# Patient Record
Sex: Female | Born: 1970 | Race: White | Hispanic: No | Smoking: Current every day smoker
Health system: Southern US, Community
[De-identification: ages and names within clinical notes are randomized; demographics above are authoritative.]

## PROBLEM LIST (undated history)

## (undated) DIAGNOSIS — R12 Heartburn: Secondary | ICD-10-CM

## (undated) DIAGNOSIS — N189 Chronic kidney disease, unspecified: Secondary | ICD-10-CM

## (undated) DIAGNOSIS — I10 Essential (primary) hypertension: Secondary | ICD-10-CM

## (undated) DIAGNOSIS — K219 Gastro-esophageal reflux disease without esophagitis: Secondary | ICD-10-CM

## (undated) DIAGNOSIS — E079 Disorder of thyroid, unspecified: Secondary | ICD-10-CM

## (undated) HISTORY — DX: Essential (primary) hypertension: I10

## (undated) HISTORY — DX: Heartburn: R12

## (undated) HISTORY — DX: Disorder of thyroid, unspecified: E07.9

---

## 1998-06-21 ENCOUNTER — Other Ambulatory Visit: Admission: RE | Admit: 1998-06-21 | Discharge: 1998-06-21 | Payer: Self-pay | Admitting: Obstetrics and Gynecology

## 1999-01-07 ENCOUNTER — Inpatient Hospital Stay (HOSPITAL_COMMUNITY): Admission: AD | Admit: 1999-01-07 | Discharge: 1999-02-04 | Payer: Self-pay | Admitting: Obstetrics and Gynecology

## 1999-01-08 ENCOUNTER — Encounter: Payer: Self-pay | Admitting: *Deleted

## 1999-01-09 ENCOUNTER — Encounter: Payer: Self-pay | Admitting: Nephrology

## 1999-01-09 ENCOUNTER — Encounter: Payer: Self-pay | Admitting: General Surgery

## 1999-01-11 ENCOUNTER — Encounter: Payer: Self-pay | Admitting: General Surgery

## 1999-01-12 ENCOUNTER — Encounter: Payer: Self-pay | Admitting: Obstetrics & Gynecology

## 1999-01-12 ENCOUNTER — Encounter: Payer: Self-pay | Admitting: Pulmonary Disease

## 1999-01-13 ENCOUNTER — Encounter: Payer: Self-pay | Admitting: Cardiothoracic Surgery

## 1999-01-14 ENCOUNTER — Encounter: Payer: Self-pay | Admitting: Pulmonary Disease

## 1999-01-14 ENCOUNTER — Encounter: Payer: Self-pay | Admitting: Cardiothoracic Surgery

## 1999-01-15 ENCOUNTER — Encounter: Payer: Self-pay | Admitting: Cardiothoracic Surgery

## 1999-01-16 ENCOUNTER — Encounter: Payer: Self-pay | Admitting: Cardiothoracic Surgery

## 1999-01-19 ENCOUNTER — Encounter: Payer: Self-pay | Admitting: Cardiothoracic Surgery

## 1999-01-20 ENCOUNTER — Encounter: Payer: Self-pay | Admitting: Neurosurgery

## 1999-01-20 ENCOUNTER — Encounter: Payer: Self-pay | Admitting: Pulmonary Disease

## 1999-01-23 ENCOUNTER — Encounter: Payer: Self-pay | Admitting: Neurosurgery

## 1999-01-28 ENCOUNTER — Encounter: Payer: Self-pay | Admitting: Pulmonary Disease

## 1999-01-28 ENCOUNTER — Encounter: Payer: Self-pay | Admitting: Neurosurgery

## 1999-01-30 ENCOUNTER — Encounter: Payer: Self-pay | Admitting: Pulmonary Disease

## 1999-02-04 ENCOUNTER — Encounter: Payer: Self-pay | Admitting: Pulmonary Disease

## 1999-02-14 ENCOUNTER — Other Ambulatory Visit: Admission: RE | Admit: 1999-02-14 | Discharge: 1999-02-14 | Payer: Self-pay | Admitting: Obstetrics and Gynecology

## 1999-05-05 ENCOUNTER — Encounter: Payer: Self-pay | Admitting: Neurosurgery

## 1999-05-05 ENCOUNTER — Ambulatory Visit (HOSPITAL_COMMUNITY): Admission: RE | Admit: 1999-05-05 | Discharge: 1999-05-05 | Payer: Self-pay | Admitting: Neurosurgery

## 1999-07-23 ENCOUNTER — Encounter: Payer: Self-pay | Admitting: Neurosurgery

## 1999-07-23 ENCOUNTER — Ambulatory Visit (HOSPITAL_COMMUNITY): Admission: RE | Admit: 1999-07-23 | Discharge: 1999-07-23 | Payer: Self-pay | Admitting: Neurosurgery

## 2000-02-04 ENCOUNTER — Encounter: Payer: Self-pay | Admitting: Neurosurgery

## 2000-02-04 ENCOUNTER — Ambulatory Visit (HOSPITAL_COMMUNITY): Admission: RE | Admit: 2000-02-04 | Discharge: 2000-02-04 | Payer: Self-pay | Admitting: Neurosurgery

## 2000-02-25 ENCOUNTER — Other Ambulatory Visit: Admission: RE | Admit: 2000-02-25 | Discharge: 2000-02-25 | Payer: Self-pay | Admitting: Obstetrics and Gynecology

## 2001-04-01 ENCOUNTER — Other Ambulatory Visit: Admission: RE | Admit: 2001-04-01 | Discharge: 2001-04-01 | Payer: Self-pay | Admitting: Obstetrics and Gynecology

## 2002-03-22 ENCOUNTER — Other Ambulatory Visit: Admission: RE | Admit: 2002-03-22 | Discharge: 2002-03-22 | Payer: Self-pay | Admitting: Obstetrics and Gynecology

## 2003-03-24 ENCOUNTER — Other Ambulatory Visit: Admission: RE | Admit: 2003-03-24 | Discharge: 2003-03-24 | Payer: Self-pay | Admitting: Obstetrics and Gynecology

## 2004-05-31 ENCOUNTER — Other Ambulatory Visit: Admission: RE | Admit: 2004-05-31 | Discharge: 2004-05-31 | Payer: Self-pay | Admitting: Obstetrics and Gynecology

## 2005-05-11 ENCOUNTER — Emergency Department (HOSPITAL_COMMUNITY): Admission: EM | Admit: 2005-05-11 | Discharge: 2005-05-11 | Payer: Self-pay | Admitting: *Deleted

## 2009-06-26 ENCOUNTER — Ambulatory Visit (HOSPITAL_COMMUNITY): Admission: RE | Admit: 2009-06-26 | Discharge: 2009-06-26 | Payer: Self-pay | Admitting: Gastroenterology

## 2009-07-24 ENCOUNTER — Encounter: Admission: RE | Admit: 2009-07-24 | Discharge: 2009-07-24 | Payer: Self-pay | Admitting: General Surgery

## 2011-11-13 ENCOUNTER — Other Ambulatory Visit: Payer: Self-pay | Admitting: Family Medicine

## 2011-11-13 DIAGNOSIS — Z1231 Encounter for screening mammogram for malignant neoplasm of breast: Secondary | ICD-10-CM

## 2011-11-25 ENCOUNTER — Ambulatory Visit
Admission: RE | Admit: 2011-11-25 | Discharge: 2011-11-25 | Disposition: A | Discharge status: Home / Self Care | Payer: BC Managed Care – PPO | Source: Ambulatory Visit | Attending: Family Medicine | Admitting: Family Medicine

## 2011-11-25 DIAGNOSIS — Z1231 Encounter for screening mammogram for malignant neoplasm of breast: Secondary | ICD-10-CM

## 2011-11-27 ENCOUNTER — Other Ambulatory Visit: Payer: Self-pay | Admitting: Family Medicine

## 2011-11-27 DIAGNOSIS — R928 Other abnormal and inconclusive findings on diagnostic imaging of breast: Secondary | ICD-10-CM

## 2011-12-01 ENCOUNTER — Ambulatory Visit
Admission: RE | Admit: 2011-12-01 | Discharge: 2011-12-01 | Disposition: A | Discharge status: Home / Self Care | Payer: BC Managed Care – PPO | Source: Ambulatory Visit | Attending: Family Medicine | Admitting: Family Medicine

## 2011-12-01 ENCOUNTER — Other Ambulatory Visit: Payer: BC Managed Care – PPO

## 2011-12-01 DIAGNOSIS — R928 Other abnormal and inconclusive findings on diagnostic imaging of breast: Secondary | ICD-10-CM

## 2012-01-15 ENCOUNTER — Other Ambulatory Visit: Payer: Self-pay | Admitting: Family Medicine

## 2012-01-15 DIAGNOSIS — H532 Diplopia: Secondary | ICD-10-CM

## 2012-01-15 DIAGNOSIS — R51 Headache: Secondary | ICD-10-CM

## 2012-01-18 ENCOUNTER — Ambulatory Visit
Admission: RE | Admit: 2012-01-18 | Discharge: 2012-01-18 | Disposition: A | Discharge status: Home / Self Care | Payer: BC Managed Care – PPO | Source: Ambulatory Visit | Attending: Family Medicine | Admitting: Family Medicine

## 2012-01-18 DIAGNOSIS — R51 Headache: Secondary | ICD-10-CM

## 2012-01-18 DIAGNOSIS — H532 Diplopia: Secondary | ICD-10-CM

## 2012-01-18 MED ORDER — GADOBENATE DIMEGLUMINE 529 MG/ML IV SOLN
17.0000 mL | Freq: Once | INTRAVENOUS | Status: AC | PRN
  Administered 2012-01-18: 17 mL via INTRAVENOUS

## 2012-06-01 ENCOUNTER — Other Ambulatory Visit: Payer: Self-pay | Admitting: Obstetrics

## 2012-06-01 DIAGNOSIS — N6489 Other specified disorders of breast: Secondary | ICD-10-CM

## 2012-07-07 ENCOUNTER — Ambulatory Visit
Admission: RE | Admit: 2012-07-07 | Discharge: 2012-07-07 | Disposition: A | Discharge status: Home / Self Care | Payer: BC Managed Care – PPO | Source: Ambulatory Visit | Attending: Obstetrics | Admitting: Obstetrics

## 2012-07-07 DIAGNOSIS — N6489 Other specified disorders of breast: Secondary | ICD-10-CM

## 2012-12-22 ENCOUNTER — Other Ambulatory Visit: Payer: Self-pay | Admitting: Obstetrics

## 2012-12-22 DIAGNOSIS — R922 Inconclusive mammogram: Secondary | ICD-10-CM

## 2012-12-22 DIAGNOSIS — N6489 Other specified disorders of breast: Secondary | ICD-10-CM

## 2013-01-10 ENCOUNTER — Ambulatory Visit
Admission: RE | Admit: 2013-01-10 | Discharge: 2013-01-10 | Disposition: A | Discharge status: Home / Self Care | Payer: BC Managed Care – PPO | Source: Ambulatory Visit | Attending: Obstetrics | Admitting: Obstetrics

## 2013-01-10 DIAGNOSIS — N6489 Other specified disorders of breast: Secondary | ICD-10-CM

## 2013-01-10 DIAGNOSIS — R922 Inconclusive mammogram: Secondary | ICD-10-CM

## 2014-03-17 ENCOUNTER — Other Ambulatory Visit: Payer: Self-pay

## 2014-03-17 DIAGNOSIS — Z1231 Encounter for screening mammogram for malignant neoplasm of breast: Secondary | ICD-10-CM

## 2014-04-12 ENCOUNTER — Ambulatory Visit
Admission: RE | Admit: 2014-04-12 | Discharge: 2014-04-12 | Disposition: A | Discharge status: Home / Self Care | Payer: BC Managed Care – PPO | Source: Ambulatory Visit

## 2014-04-12 ENCOUNTER — Encounter (INDEPENDENT_AMBULATORY_CARE_PROVIDER_SITE_OTHER): Payer: Self-pay

## 2014-04-12 DIAGNOSIS — Z1231 Encounter for screening mammogram for malignant neoplasm of breast: Secondary | ICD-10-CM

## 2015-04-02 ENCOUNTER — Other Ambulatory Visit: Payer: Self-pay

## 2015-04-02 DIAGNOSIS — Z1231 Encounter for screening mammogram for malignant neoplasm of breast: Secondary | ICD-10-CM

## 2015-04-20 ENCOUNTER — Ambulatory Visit
Admission: RE | Admit: 2015-04-20 | Discharge: 2015-04-20 | Disposition: A | Discharge status: Home / Self Care | Payer: 59 | Source: Ambulatory Visit

## 2015-04-20 DIAGNOSIS — Z1231 Encounter for screening mammogram for malignant neoplasm of breast: Secondary | ICD-10-CM

## 2017-04-02 ENCOUNTER — Other Ambulatory Visit: Payer: Self-pay | Admitting: Gastroenterology

## 2017-04-02 DIAGNOSIS — R1011 Right upper quadrant pain: Secondary | ICD-10-CM

## 2017-04-06 ENCOUNTER — Encounter (HOSPITAL_COMMUNITY)
Admission: RE | Admit: 2017-04-06 | Discharge: 2017-04-06 | Disposition: A | Discharge status: Home / Self Care | Payer: 59 | Source: Ambulatory Visit | Attending: Gastroenterology | Admitting: Gastroenterology

## 2017-04-06 DIAGNOSIS — R1011 Right upper quadrant pain: Secondary | ICD-10-CM | POA: Diagnosis present

## 2017-04-06 MED ORDER — TECHNETIUM TC 99M MEBROFENIN IV KIT
5.3300 | PACK | Freq: Once | INTRAVENOUS | Status: AC | PRN
  Administered 2017-04-06: 5.33 via INTRAVENOUS

## 2017-07-06 DIAGNOSIS — N898 Other specified noninflammatory disorders of vagina: Secondary | ICD-10-CM | POA: Diagnosis not present

## 2017-11-25 DIAGNOSIS — I10 Essential (primary) hypertension: Secondary | ICD-10-CM | POA: Diagnosis not present

## 2017-11-25 DIAGNOSIS — N390 Urinary tract infection, site not specified: Secondary | ICD-10-CM | POA: Diagnosis not present

## 2017-11-26 ENCOUNTER — Other Ambulatory Visit: Payer: Self-pay | Admitting: Nephrology

## 2017-11-26 DIAGNOSIS — R3129 Other microscopic hematuria: Secondary | ICD-10-CM | POA: Diagnosis not present

## 2017-11-27 ENCOUNTER — Ambulatory Visit
Admission: RE | Admit: 2017-11-27 | Discharge: 2017-11-27 | Disposition: A | Discharge status: Home / Self Care | Payer: 59 | Source: Ambulatory Visit | Attending: Nephrology | Admitting: Nephrology

## 2017-11-27 DIAGNOSIS — R3129 Other microscopic hematuria: Secondary | ICD-10-CM | POA: Diagnosis not present

## 2017-11-30 DIAGNOSIS — R3129 Other microscopic hematuria: Secondary | ICD-10-CM | POA: Diagnosis not present

## 2017-11-30 DIAGNOSIS — I10 Essential (primary) hypertension: Secondary | ICD-10-CM | POA: Diagnosis not present

## 2017-11-30 DIAGNOSIS — N390 Urinary tract infection, site not specified: Secondary | ICD-10-CM | POA: Diagnosis not present

## 2017-12-07 ENCOUNTER — Other Ambulatory Visit: Payer: Self-pay | Admitting: Nephrology

## 2017-12-07 DIAGNOSIS — R8281 Pyuria: Secondary | ICD-10-CM

## 2017-12-07 DIAGNOSIS — I10 Essential (primary) hypertension: Secondary | ICD-10-CM

## 2017-12-07 DIAGNOSIS — R3129 Other microscopic hematuria: Secondary | ICD-10-CM

## 2017-12-09 ENCOUNTER — Other Ambulatory Visit: Payer: Self-pay | Admitting: Nephrology

## 2017-12-09 ENCOUNTER — Ambulatory Visit
Admission: RE | Admit: 2017-12-09 | Discharge: 2017-12-09 | Disposition: A | Discharge status: Home / Self Care | Payer: 59 | Source: Ambulatory Visit | Attending: Nephrology | Admitting: Nephrology

## 2017-12-09 DIAGNOSIS — R8281 Pyuria: Secondary | ICD-10-CM

## 2017-12-09 DIAGNOSIS — R3129 Other microscopic hematuria: Secondary | ICD-10-CM

## 2017-12-09 DIAGNOSIS — N289 Disorder of kidney and ureter, unspecified: Secondary | ICD-10-CM | POA: Diagnosis not present

## 2017-12-09 DIAGNOSIS — I10 Essential (primary) hypertension: Secondary | ICD-10-CM

## 2017-12-09 MED ORDER — GADOBENATE DIMEGLUMINE 529 MG/ML IV SOLN
18.0000 mL | Freq: Once | INTRAVENOUS | Status: AC | PRN
  Administered 2017-12-09: 18 mL via INTRAVENOUS

## 2017-12-10 DIAGNOSIS — Z1231 Encounter for screening mammogram for malignant neoplasm of breast: Secondary | ICD-10-CM | POA: Diagnosis not present

## 2017-12-10 DIAGNOSIS — Z01411 Encounter for gynecological examination (general) (routine) with abnormal findings: Secondary | ICD-10-CM | POA: Diagnosis not present

## 2017-12-10 DIAGNOSIS — Z6829 Body mass index (BMI) 29.0-29.9, adult: Secondary | ICD-10-CM | POA: Diagnosis not present

## 2017-12-10 DIAGNOSIS — N393 Stress incontinence (female) (male): Secondary | ICD-10-CM | POA: Diagnosis not present

## 2017-12-10 DIAGNOSIS — Z124 Encounter for screening for malignant neoplasm of cervix: Secondary | ICD-10-CM | POA: Diagnosis not present

## 2017-12-12 ENCOUNTER — Other Ambulatory Visit: Payer: 59

## 2017-12-28 DIAGNOSIS — Z0000 Encounter for general adult medical examination without abnormal findings: Secondary | ICD-10-CM | POA: Diagnosis not present

## 2017-12-28 DIAGNOSIS — I10 Essential (primary) hypertension: Secondary | ICD-10-CM | POA: Diagnosis not present

## 2017-12-28 DIAGNOSIS — R3129 Other microscopic hematuria: Secondary | ICD-10-CM | POA: Diagnosis not present

## 2018-01-01 DIAGNOSIS — K219 Gastro-esophageal reflux disease without esophagitis: Secondary | ICD-10-CM | POA: Diagnosis not present

## 2018-01-01 DIAGNOSIS — Z6829 Body mass index (BMI) 29.0-29.9, adult: Secondary | ICD-10-CM | POA: Diagnosis not present

## 2018-01-01 DIAGNOSIS — I10 Essential (primary) hypertension: Secondary | ICD-10-CM | POA: Diagnosis not present

## 2018-01-01 DIAGNOSIS — Z23 Encounter for immunization: Secondary | ICD-10-CM | POA: Diagnosis not present

## 2018-01-05 DIAGNOSIS — R87612 Low grade squamous intraepithelial lesion on cytologic smear of cervix (LGSIL): Secondary | ICD-10-CM | POA: Diagnosis not present

## 2018-01-05 DIAGNOSIS — Z3200 Encounter for pregnancy test, result unknown: Secondary | ICD-10-CM | POA: Diagnosis not present

## 2018-01-05 DIAGNOSIS — R109 Unspecified abdominal pain: Secondary | ICD-10-CM | POA: Diagnosis not present

## 2018-01-08 DIAGNOSIS — Z23 Encounter for immunization: Secondary | ICD-10-CM | POA: Diagnosis not present

## 2018-02-23 DIAGNOSIS — Z0000 Encounter for general adult medical examination without abnormal findings: Secondary | ICD-10-CM | POA: Diagnosis not present

## 2018-03-03 DIAGNOSIS — Z0000 Encounter for general adult medical examination without abnormal findings: Secondary | ICD-10-CM | POA: Diagnosis not present

## 2018-03-03 DIAGNOSIS — Z23 Encounter for immunization: Secondary | ICD-10-CM | POA: Diagnosis not present

## 2018-03-03 DIAGNOSIS — Z6829 Body mass index (BMI) 29.0-29.9, adult: Secondary | ICD-10-CM | POA: Diagnosis not present

## 2018-04-02 DIAGNOSIS — E039 Hypothyroidism, unspecified: Secondary | ICD-10-CM | POA: Diagnosis not present

## 2018-04-05 DIAGNOSIS — Z720 Tobacco use: Secondary | ICD-10-CM | POA: Diagnosis not present

## 2018-04-05 DIAGNOSIS — Z6830 Body mass index (BMI) 30.0-30.9, adult: Secondary | ICD-10-CM | POA: Diagnosis not present

## 2018-04-05 DIAGNOSIS — E039 Hypothyroidism, unspecified: Secondary | ICD-10-CM | POA: Diagnosis not present

## 2018-04-05 DIAGNOSIS — Z23 Encounter for immunization: Secondary | ICD-10-CM | POA: Diagnosis not present

## 2018-05-24 DIAGNOSIS — E039 Hypothyroidism, unspecified: Secondary | ICD-10-CM | POA: Diagnosis not present

## 2018-05-25 DIAGNOSIS — M2242 Chondromalacia patellae, left knee: Secondary | ICD-10-CM | POA: Diagnosis not present

## 2018-05-28 DIAGNOSIS — Z720 Tobacco use: Secondary | ICD-10-CM | POA: Diagnosis not present

## 2018-05-28 DIAGNOSIS — E039 Hypothyroidism, unspecified: Secondary | ICD-10-CM | POA: Diagnosis not present

## 2018-05-28 DIAGNOSIS — Z6829 Body mass index (BMI) 29.0-29.9, adult: Secondary | ICD-10-CM | POA: Diagnosis not present

## 2018-06-01 DIAGNOSIS — E039 Hypothyroidism, unspecified: Secondary | ICD-10-CM | POA: Diagnosis not present

## 2018-06-01 DIAGNOSIS — I10 Essential (primary) hypertension: Secondary | ICD-10-CM | POA: Diagnosis not present

## 2018-06-01 DIAGNOSIS — R3129 Other microscopic hematuria: Secondary | ICD-10-CM | POA: Diagnosis not present

## 2018-06-30 DIAGNOSIS — E875 Hyperkalemia: Secondary | ICD-10-CM | POA: Diagnosis not present

## 2018-07-07 DIAGNOSIS — R238 Other skin changes: Secondary | ICD-10-CM | POA: Diagnosis not present

## 2018-07-07 DIAGNOSIS — T1490XA Injury, unspecified, initial encounter: Secondary | ICD-10-CM | POA: Diagnosis not present

## 2018-07-07 DIAGNOSIS — A4902 Methicillin resistant Staphylococcus aureus infection, unspecified site: Secondary | ICD-10-CM | POA: Diagnosis not present

## 2018-07-12 DIAGNOSIS — A4902 Methicillin resistant Staphylococcus aureus infection, unspecified site: Secondary | ICD-10-CM | POA: Diagnosis not present

## 2018-08-27 DIAGNOSIS — E039 Hypothyroidism, unspecified: Secondary | ICD-10-CM | POA: Diagnosis not present

## 2018-08-30 DIAGNOSIS — M2242 Chondromalacia patellae, left knee: Secondary | ICD-10-CM | POA: Diagnosis not present

## 2018-09-01 DIAGNOSIS — I10 Essential (primary) hypertension: Secondary | ICD-10-CM | POA: Diagnosis not present

## 2018-09-01 DIAGNOSIS — Z720 Tobacco use: Secondary | ICD-10-CM | POA: Diagnosis not present

## 2018-09-01 DIAGNOSIS — K219 Gastro-esophageal reflux disease without esophagitis: Secondary | ICD-10-CM | POA: Diagnosis not present

## 2018-09-24 ENCOUNTER — Telehealth: Payer: Self-pay | Admitting: *Deleted

## 2018-09-24 DIAGNOSIS — Z20822 Contact with and (suspected) exposure to covid-19: Secondary | ICD-10-CM

## 2018-09-24 NOTE — Telephone Encounter (Signed)
Pt scheduled for testing Monday 09/27/2018 at [facility] site.  Requested by Dr. Milana Kidney. Paula Fernandez, pt with possible exposure  Testing process reviewed. Pt verbalizes understanding.  Pts CB# [phone]   Practices # [phone]  [phone]

## 2018-09-27 ENCOUNTER — Other Ambulatory Visit: Payer: 59

## 2019-07-02 ENCOUNTER — Other Ambulatory Visit: Payer: Self-pay

## 2019-07-02 ENCOUNTER — Ambulatory Visit: Payer: 59 | Attending: Internal Medicine

## 2019-07-02 DIAGNOSIS — Z23 Encounter for immunization: Secondary | ICD-10-CM

## 2019-07-02 NOTE — Progress Notes (Signed)
   Covid-19 Vaccination Clinic  Name:  Paula Fernandez    MRN: [id number] DOB: [birth year]  07/02/2019  [name] was observed post Covid-19 immunization for 15 minutes without incident. She was provided with Vaccine Information Sheet and instruction to access the V-Safe system.   [name] was instructed to call 911 with any severe reactions post vaccine: Marland Kitchen Difficulty breathing  . Swelling of face and throat  . A fast heartbeat  . A bad rash all over body  . Dizziness and weakness   Immunizations Administered    Name Date Dose VIS Date Route   Pfizer COVID-19 Vaccine 07/02/2019 11:37 AM 0.3 mL 03/25/2019 Intramuscular   Manufacturer: [place]   Lot: [id number]   [place]: [id number]

## 2019-07-26 ENCOUNTER — Ambulatory Visit: Payer: 59 | Attending: Internal Medicine

## 2019-07-26 DIAGNOSIS — Z23 Encounter for immunization: Secondary | ICD-10-CM

## 2019-07-26 NOTE — Progress Notes (Signed)
   Covid-19 Vaccination Clinic  Name:  Paula Fernandez    MRN: [id number] DOB: [birth year]  07/26/2019  Paula Fernandez was observed post Covid-19 immunization for 15 minutes without incident. She was provided with Vaccine Information Sheet and instruction to access the V-Safe system.   Paula Fernandez was instructed to call 911 with any severe reactions post vaccine: Marland Kitchen Difficulty breathing  . Swelling of face and throat  . A fast heartbeat  . A bad rash all over body  . Dizziness and weakness   Immunizations Administered    Name Date Dose VIS Date Route   Pfizer COVID-19 Vaccine 07/26/2019  3:22 PM 0.3 mL 03/25/2019 Intramuscular   Manufacturer: [place]   Lot: [id number]   [place]: [id number]

## 2020-04-10 DIAGNOSIS — N920 Excessive and frequent menstruation with regular cycle: Secondary | ICD-10-CM | POA: Insufficient documentation

## 2020-04-10 DIAGNOSIS — E039 Hypothyroidism, unspecified: Secondary | ICD-10-CM | POA: Insufficient documentation

## 2020-04-10 DIAGNOSIS — B977 Papillomavirus as the cause of diseases classified elsewhere: Secondary | ICD-10-CM | POA: Insufficient documentation

## 2020-04-10 DIAGNOSIS — N870 Mild cervical dysplasia: Secondary | ICD-10-CM | POA: Insufficient documentation

## 2020-04-10 DIAGNOSIS — I729 Aneurysm of unspecified site: Secondary | ICD-10-CM | POA: Insufficient documentation

## 2020-04-10 DIAGNOSIS — Z720 Tobacco use: Secondary | ICD-10-CM | POA: Insufficient documentation

## 2020-04-10 DIAGNOSIS — F341 Dysthymic disorder: Secondary | ICD-10-CM | POA: Insufficient documentation

## 2020-05-11 IMAGING — MR MR ABDOMEN WO/W CM
10 of 16 series · 22 of 48 positions shown · IV contrast (15 ml Multihance)
Comparison: Ultrasound 11/27/2017 and CT scan 07/25/1998

CLINICAL DATA: Indeterminate right renal lesion seen on recent
ultrasound examination.

EXAM:
MRI ABDOMEN WITHOUT AND WITH CONTRAST
TECHNIQUE: Multiplanar multisequence MR imaging of the abdomen was performed
both before and after the administration of intravenous contrast.
CONTRAST:  18mL MULTIHANCE GADOBENATE DIMEGLUMINE 529 MG/ML IV SOLN

[Series 6: [id number] · axial · 6.0mm · 1.98mm/px · 1 of 29 slices shown]
[im 1/29]
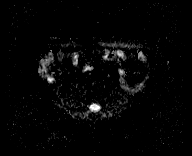

[Series 7: T2 · axial · 6.5mm · 0.74mm/px · 1 of 24 slices shown (1 of 3)]
[im 1/24]
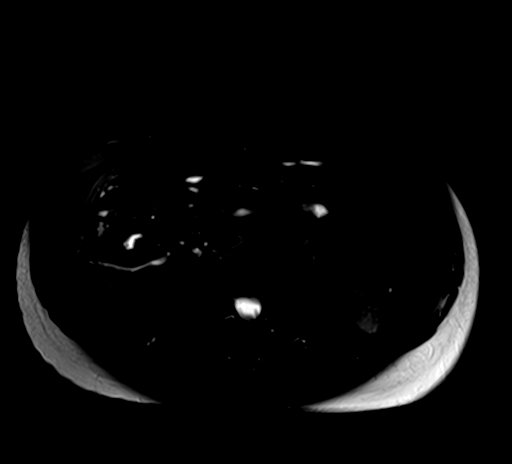

[Series 8: T2 · coronal · 5.0mm · 1.56mm/px · 1 of 20 slices shown (2 of 3)]
[im 1/20]
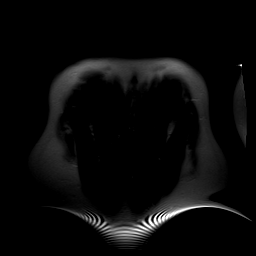

[Series 9: axial tru fisp · axial · 4.0mm · 1.48mm/px · 1 of 34 slices shown]
[im 1/34]
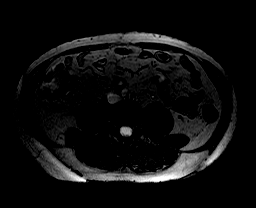

[Series 10: T2 · axial · 5.0mm · 1.37mm/px · z∈[-128,+61]mm · 2 of 30 slices shown (3 of 3)]
[im 1/30]
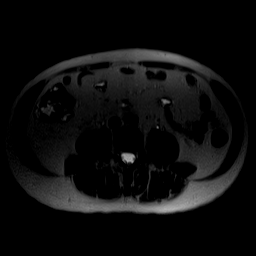
[im 30/30]
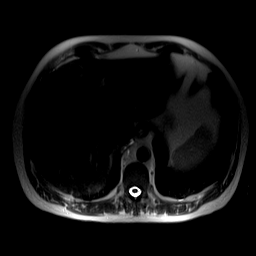

[Series 11: axial in out · axial · 5.5mm · 0.70mm/px · z∈[-127,+45]mm · 3 of 52 slices shown]
[im 1/52]
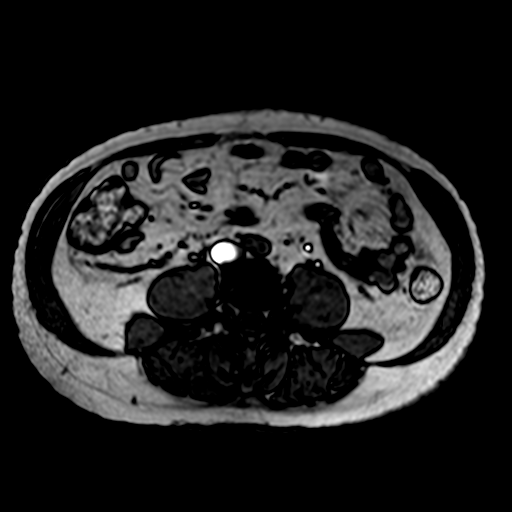
[im 26/52]
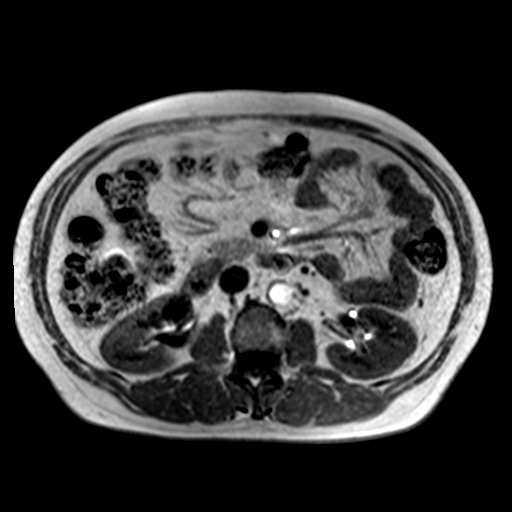
[im 52/52]
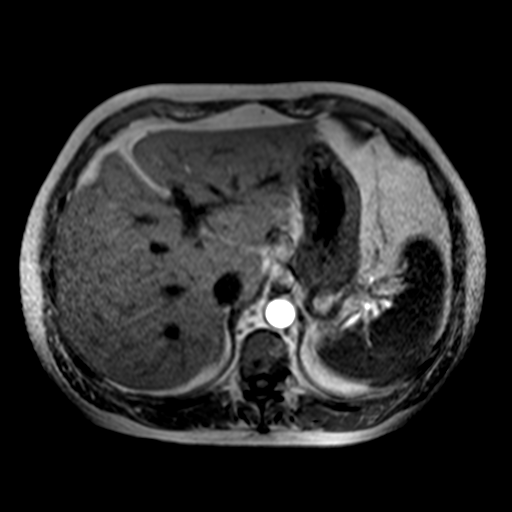

[Series 12: T1 dynamic · axial · non-contrast · 3.0mm · 0.70mm/px · z∈[-133,+44]mm · 4 of 60 slices shown]
[im 1/60]
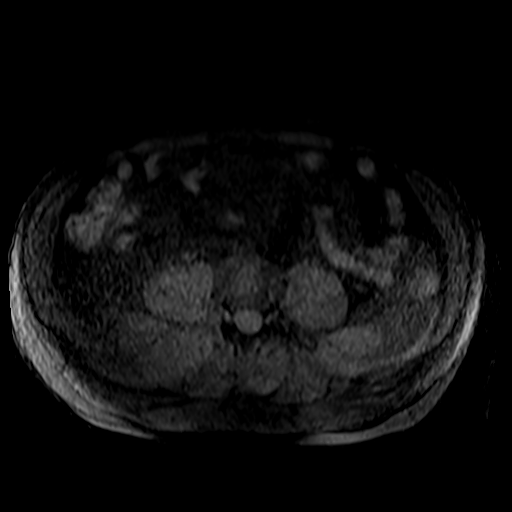
[im 20/60]
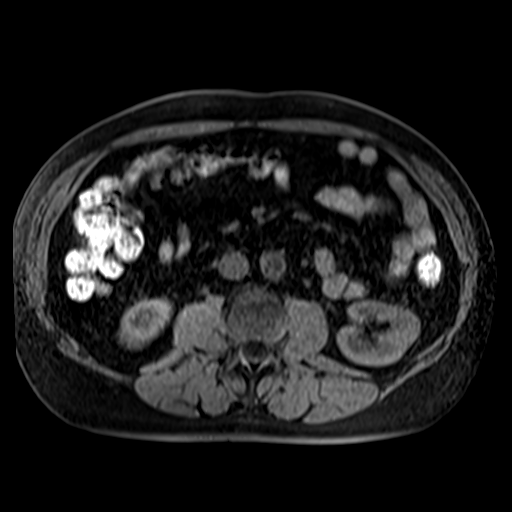
[im 40/60]
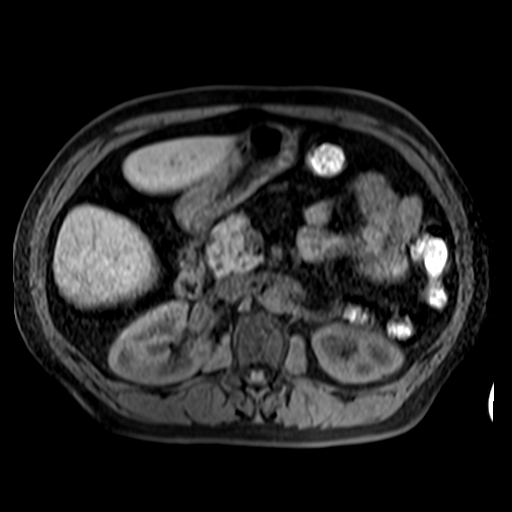
[im 60/60]
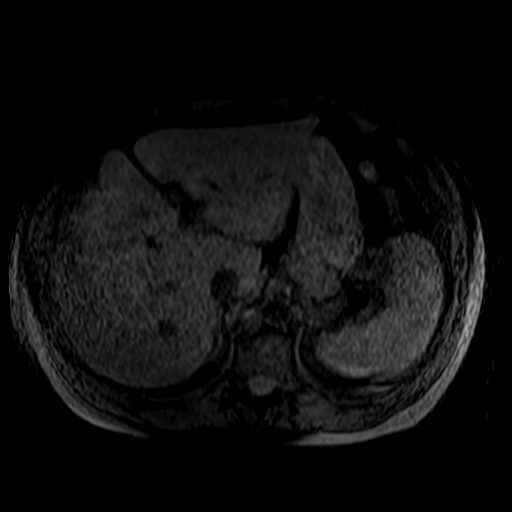

[Series 13: post 25 sec · axial · 3.0mm · 0.70mm/px · z∈[-133,+44]mm · 4 of 60 slices shown]
[im 1/60]
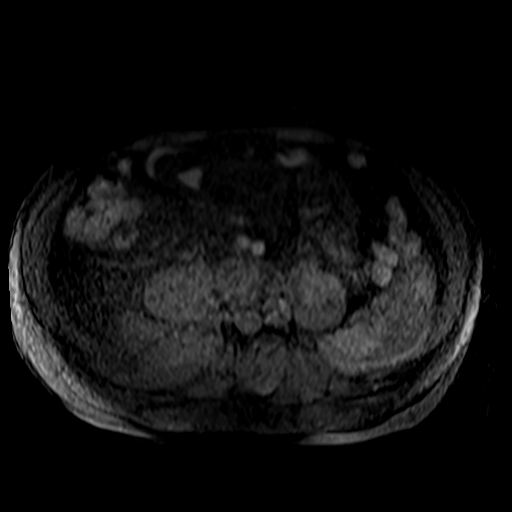
[im 20/60]
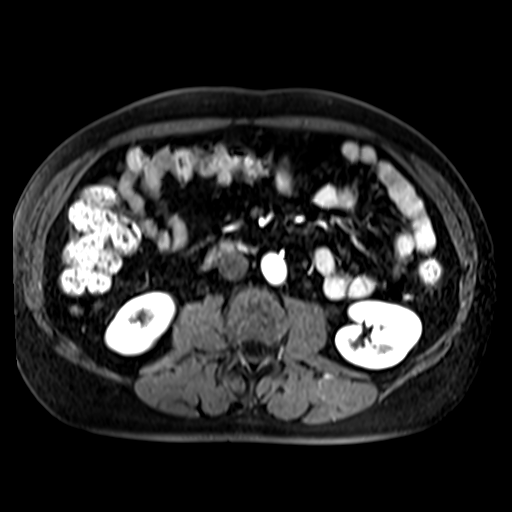
[im 40/60]
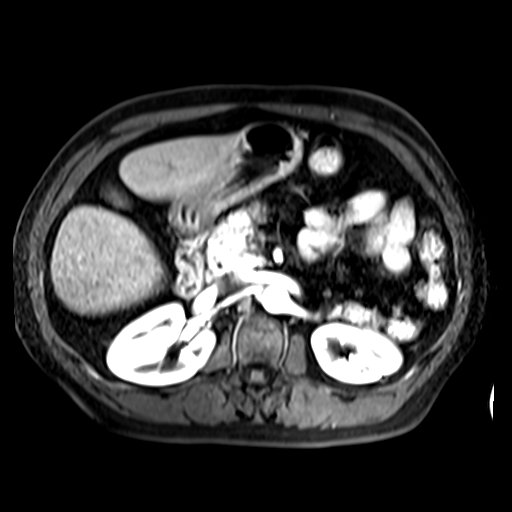
[im 60/60]
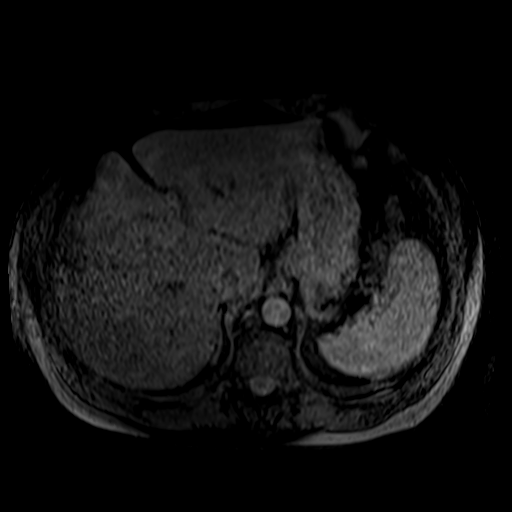

[Series 14: post 25 sec_sub · axial · 3.0mm · 0.70mm/px · z∈[-133,+44]mm · 4 of 60 slices shown]
[im 1/60]
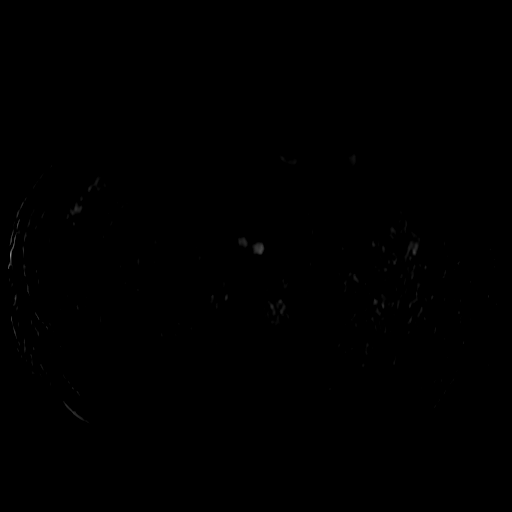
[im 20/60]
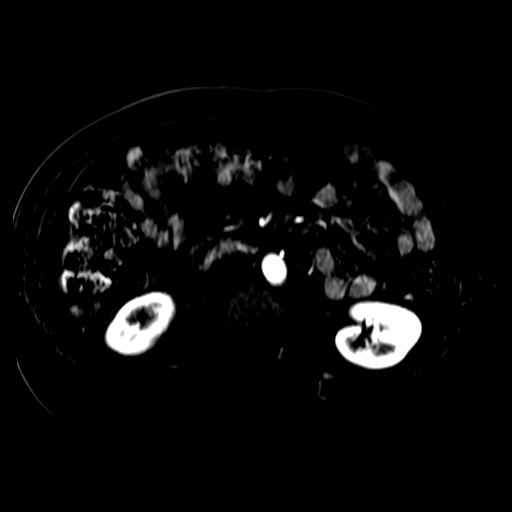
[im 40/60]
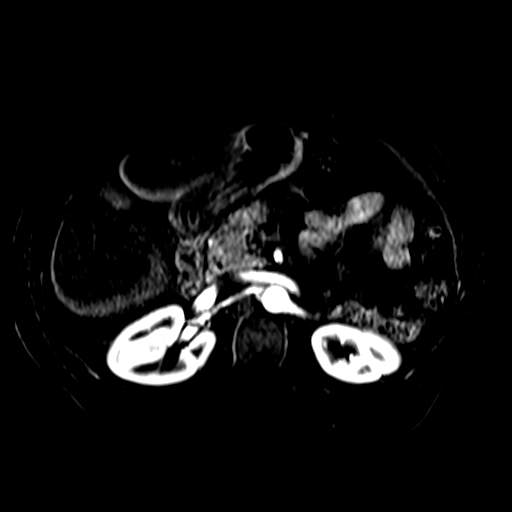
[im 60/60]
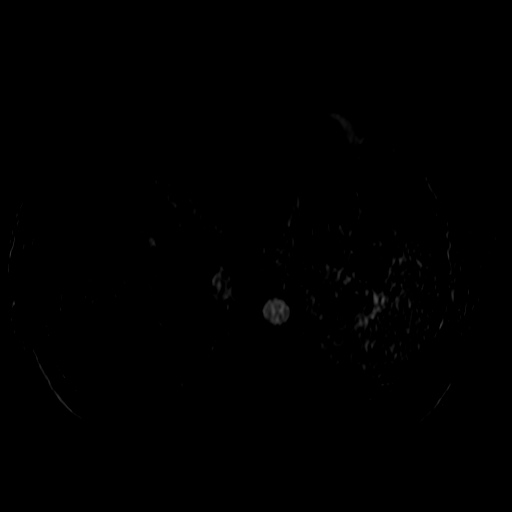

[Series 15: post 45 sec · axial · 3.0mm · 0.70mm/px · 1 of 60 slices shown]
[im 1/60]
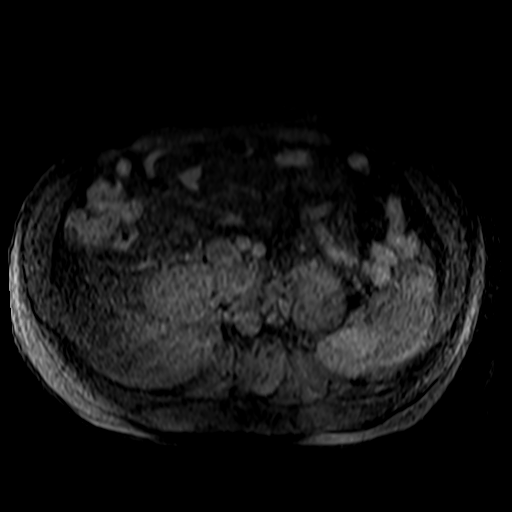

[22 of 48 positions shown; findings below may reference images not displayed]

FINDINGS: Lower chest: The lung bases are clear of an acute process. No
pleural or pericardial effusion. Moderate eventration of the right
hemidiaphragm is noted. The distal esophagus is grossly normal.

Hepatobiliary: No focal hepatic lesions or intrahepatic biliary
dilatation. The gallbladder is normal. No common bile duct
dilatation.

Pancreas:  No mass, inflammation or ductal dilatation.

Spleen:  Normal size.  No focal lesions.

Adrenals/Urinary Tract:  The adrenal glands are normal.

The left kidney is normal.

The right kidney demonstrates a simple cyst measuring 16 mm in the
anterior cortex at the midpole level.

The suspected lesion on the ultrasound examination is a benign
column of Bertin or Cloison. This is a normal anatomic variation. No
mass is identified and this appears stable when compared to the
prior CT scan from 5422. No hydronephrosis. No obvious renal calculi
by MRI.

Stomach/Bowel: Visualized portions within the abdomen are
unremarkable.

Vascular/Lymphatic: No pathologically enlarged lymph nodes
identified. No abdominal aortic aneurysm demonstrated.

Other:  No ascites or abdominal wall hernia.

Musculoskeletal: No significant bony findings.
IMPRESSION: 1. The abnormality on the recent ultrasound examination is a benign
column of Bertin. There is also a simple 16 mm right renal cyst. No
worrisome renal lesions.
2. No acute abdominal findings, mass lesions or adenopathy.

## 2020-06-07 ENCOUNTER — Encounter: Payer: Self-pay | Admitting: Gastroenterology

## 2020-07-13 ENCOUNTER — Ambulatory Visit (AMBULATORY_SURGERY_CENTER): Payer: Self-pay

## 2020-07-13 ENCOUNTER — Other Ambulatory Visit: Payer: Self-pay

## 2020-07-13 VITALS — Ht 68.9 in | Wt 205.0 lb

## 2020-07-13 DIAGNOSIS — Z1211 Encounter for screening for malignant neoplasm of colon: Secondary | ICD-10-CM

## 2020-07-13 MED ORDER — SUTAB 1479-225-188 MG PO TABS: 12.0000 | ORAL_TABLET | ORAL | 0 refills | Status: DC

## 2020-07-13 NOTE — Progress Notes (Signed)
No egg or soy allergy known to patient  No issues with past sedation with any surgeries or procedures Patient denies ever being told they had issues or difficulty with intubation  No FH of Malignant Hyperthermia No diet pills per patient No home 02 use per patient  No blood thinners per patient  Pt denies issues with constipation  No A fib or A flutter  EMMI video to pt or via [place] 19 guidelines implemented in [place] today with Pt and RN  Pt is fully vaccinated  for Covid   sutab  Coupon given to pt in PV today , Code to Pharmacy and  NO PA's for preps discussed with pt In PV today  Discussed with pt there will be an out-of-pocket cost for prep and that varies from $0 to 70 dollars   Due to the COVID-19 pandemic we are asking patients to follow certain guidelines.  Pt aware of COVID protocols and [facility] guidelines

## 2020-08-03 ENCOUNTER — Encounter: Payer: Self-pay | Admitting: Gastroenterology

## 2020-08-03 ENCOUNTER — Ambulatory Visit (AMBULATORY_SURGERY_CENTER): Payer: No Typology Code available for payment source | Admitting: Gastroenterology

## 2020-08-03 ENCOUNTER — Other Ambulatory Visit: Payer: Self-pay

## 2020-08-03 VITALS — BP 134/87 | HR 69 | Temp 97.7°F | Resp 16 | Ht 68.9 in | Wt 205.0 lb

## 2020-08-03 DIAGNOSIS — D125 Benign neoplasm of sigmoid colon: Secondary | ICD-10-CM

## 2020-08-03 DIAGNOSIS — Z1211 Encounter for screening for malignant neoplasm of colon: Secondary | ICD-10-CM | POA: Diagnosis present

## 2020-08-03 DIAGNOSIS — D124 Benign neoplasm of descending colon: Secondary | ICD-10-CM

## 2020-08-03 DIAGNOSIS — K640 First degree hemorrhoids: Secondary | ICD-10-CM

## 2020-08-03 DIAGNOSIS — K635 Polyp of colon: Secondary | ICD-10-CM | POA: Diagnosis not present

## 2020-08-03 DIAGNOSIS — K5730 Diverticulosis of large intestine without perforation or abscess without bleeding: Secondary | ICD-10-CM

## 2020-08-03 MED ORDER — SODIUM CHLORIDE 0.9 % IV SOLN: 500.0000 mL | Freq: Once | INTRAVENOUS | Status: DC

## 2020-08-03 NOTE — Progress Notes (Signed)
Report given to PACU, vss 

## 2020-08-03 NOTE — Patient Instructions (Signed)
YOU HAD AN ENDOSCOPIC PROCEDURE TODAY AT THE [facility]:   Refer to the procedure report that was given to you for any specific questions about what was found during the examination.  If the procedure report does not answer your questions, please call your gastroenterologist to clarify.  If you requested that your care partner not be given the details of your procedure findings, then the procedure report has been included in a sealed envelope for you to review at your convenience later.  YOU SHOULD EXPECT: Some feelings of bloating in the abdomen. Passage of more gas than usual.  Walking can help get rid of the air that was put into your GI tract during the procedure and reduce the bloating. If you had a lower endoscopy (such as a colonoscopy or flexible sigmoidoscopy) you may notice spotting of blood in your stool or on the toilet paper. If you underwent a bowel prep for your procedure, you may not have a normal bowel movement for a few days.  Please Note:  You might notice some irritation and congestion in your nose or some drainage.  This is from the oxygen used during your procedure.  There is no need for concern and it should clear up in a day or so.  SYMPTOMS TO REPORT IMMEDIATELY:   Following lower endoscopy (colonoscopy or flexible sigmoidoscopy):  Excessive amounts of blood in the stool  Significant tenderness or worsening of abdominal pains  Swelling of the abdomen that is new, acute  Fever of 100F or higher   Following upper endoscopy (EGD)  Vomiting of blood or coffee ground material  New chest pain or pain under the shoulder blades  Painful or persistently difficult swallowing  New shortness of breath  Fever of 100F or higher  Black, tarry-looking stools  For urgent or emergent issues, a gastroenterologist can be reached at any hour by calling [phone]. Do not use MyChart messaging for urgent concerns.    DIET:  We do recommend a small meal at first, but  then you may proceed to your regular diet.  Drink plenty of fluids but you should avoid alcoholic beverages for 24 hours.  ACTIVITY:  You should plan to take it easy for the rest of today and you should NOT DRIVE or use heavy machinery until tomorrow (because of the sedation medicines used during the test).    FOLLOW UP: Our staff will call the number listed on your records 48-72 hours following your procedure to check on you and address any questions or concerns that you may have regarding the information given to you following your procedure. If we do not reach you, we will leave a message.  We will attempt to reach you two times.  During this call, we will ask if you have developed any symptoms of COVID 19. If you develop any symptoms (ie: fever, flu-like symptoms, shortness of breath, cough etc.) before then, please call [phone].  If you test positive for Covid 19 in the 2 weeks post procedure, please call and report this information to us.    If any biopsies were taken you will be contacted by phone or by letter within the next 1-3 weeks.  Please call us at [phone] if you have not heard about the biopsies in 3 weeks.    SIGNATURES/CONFIDENTIALITY: You and/or your care partner have signed paperwork which will be entered into your electronic medical record.  These signatures attest to the fact that that the information above on   your After Visit Summary has been reviewed and is understood.  Full responsibility of the confidentiality of this discharge information lies with you and/or your care-partner. 

## 2020-08-03 NOTE — Progress Notes (Signed)
Called to room to assist during endoscopic procedure.  Patient ID and intended procedure confirmed with present staff. Received instructions for my participation in the procedure from the performing physician.  

## 2020-08-03 NOTE — Op Note (Signed)
[place] Patient Name: Paula Fernandez Procedure Date: 08/03/2020 8:58 AM MRN: [id number] Endoscopist: Gerrit Heck , MD Age: 50 Referring MD:  Date of Birth: [birth year] Gender: Female Account #: [id number] Procedure:                Colonoscopy Indications:              Screening for colorectal malignant neoplasm, This                            is the patient's first colonoscopy Medicines:                Monitored Anesthesia Care Procedure:                Pre-Anesthesia Assessment:                           - Prior to the procedure, a History and Physical                            was performed, and patient medications and                            allergies were reviewed. The patient's tolerance of                            previous anesthesia was also reviewed. The risks                            and benefits of the procedure and the sedation                            options and risks were discussed with the patient.                            All questions were answered, and informed consent                            was obtained. Prior Anticoagulants: The patient has                            taken no previous anticoagulant or antiplatelet                            agents. ASA Grade Assessment: II - A patient with                            mild systemic disease. After reviewing the risks                            and benefits, the patient was deemed in                            satisfactory condition to undergo the procedure.  After obtaining informed consent, the colonoscope                            was passed under direct vision. Throughout the                            procedure, the patient's blood pressure, pulse, and                            oxygen saturations were monitored continuously. The                            Olympus CF-HQ190 [id number]) Colonoscope was                            introduced through the anus and  advanced to the the                            cecum, identified by the appendiceal orifice, IC                            valve and transillumination. The colonoscopy was                            performed without difficulty. The patient tolerated                            the procedure well. The quality of the bowel                            preparation was good. The terminal ileum, ileocecal                            valve, appendiceal orifice, and rectum were                            photographed. Scope In: 9:01:20 AM Scope Out: 9:18:51 AM Scope Withdrawal Time: 0 hours 14 minutes 9 seconds  Total Procedure Duration: 0 hours 17 minutes 31 seconds  Findings:                 The perianal and digital rectal examinations were                            normal.                           Two sessile polyps were found in the sigmoid colon                            and descending colon. The polyps were 3 to 6 mm in                            size. These polyps were removed with a cold snare.  Resection and retrieval were complete. Estimated                            blood loss was minimal.                           A few small-mouthed diverticula were found in the                            sigmoid colon and descending colon.                           Non-bleeding internal hemorrhoids were found during                            retroflexion. The hemorrhoids were small.                           The terminal ileum appeared normal. Complications:            No immediate complications. Estimated Blood Loss:     Estimated blood loss was minimal. Impression:               - Two 3 to 6 mm polyps in the sigmoid colon and in                            the descending colon, removed with a cold snare.                            Resected and retrieved.                           - Diverticulosis in the sigmoid colon and in the                            descending  colon.                           - Non-bleeding internal hemorrhoids.                           - The examined portion of the ileum was normal. Recommendation:           - Patient has a contact number available for                            emergencies. The signs and symptoms of potential                            delayed complications were discussed with the                            patient. Return to normal activities tomorrow.                            Written discharge instructions were provided to  the                            patient.                           - Resume previous diet.                           - Continue present medications.                           - Await pathology results.                           - Repeat colonoscopy for surveillance based on                            pathology results.                           - Return to GI office PRN.                           - Use fiber, for example Citrucel, Fibercon, Konsyl                            or Metamucil.                           - Internal hemorrhoids were noted on this study and                            may be amenable to hemorrhoid band ligation. If you                            are interested in further treatment of these                            hemorrhoids with band ligation, please contact my                            clinic to set up an appointment for evaluation and                            treatment. Gerrit Heck, MD 08/03/2020 9:22:48 AM

## 2020-08-07 ENCOUNTER — Telehealth: Payer: Self-pay

## 2020-08-07 NOTE — Telephone Encounter (Signed)
  Follow up Call-  Call back number 08/03/2020  Post procedure Call Back phone  # [phone]  Permission to leave phone message Yes  Some recent data might be hidden     Patient questions:  Do you have a fever, pain , or abdominal swelling? No. Pain Score  0 *  Have you tolerated food without any problems? Yes.    Have you been able to return to your normal activities? Yes.    Do you have any questions about your discharge instructions: Diet   No. Medications  No. Follow up visit  No.  Do you have questions or concerns about your Care? No.  Actions: * If pain score is 4 or above: No action needed, pain <4.  1. Have you developed a fever since your procedure? no  2.   Have you had an respiratory symptoms (SOB or cough) since your procedure? no  3.   Have you tested positive for COVID 19 since your procedure no  4.   Have you had any family members/close contacts diagnosed with the COVID 19 since your procedure?  no   If yes to any of these questions please route to Joylene John, RN and Joella Prince, RN

## 2020-08-16 ENCOUNTER — Telehealth: Payer: Self-pay | Admitting: Gastroenterology

## 2020-08-16 ENCOUNTER — Encounter: Payer: Self-pay | Admitting: Gastroenterology

## 2020-08-16 NOTE — Telephone Encounter (Signed)
Inbound call from patient wants to discuss lab results. Best contact number [phone]

## 2020-08-16 NOTE — Telephone Encounter (Signed)
The polyps removed during the recent colonoscopy were benign Hyperplastic Polyps. These polyps are NOT precancerous and harbor no malignant potential.  Plan for repeat colonoscopy in 10 years for ongoing screening.  A letter was mailed out the patient with these results as well for her records.

## 2020-08-17 NOTE — Telephone Encounter (Signed)
Patient notified

## 2021-01-16 ENCOUNTER — Other Ambulatory Visit: Payer: Self-pay | Admitting: Nephrology

## 2021-01-16 DIAGNOSIS — M549 Dorsalgia, unspecified: Secondary | ICD-10-CM

## 2021-01-16 DIAGNOSIS — R310 Gross hematuria: Secondary | ICD-10-CM

## 2021-01-17 ENCOUNTER — Ambulatory Visit
Admission: RE | Admit: 2021-01-17 | Discharge: 2021-01-17 | Disposition: A | Discharge status: Home / Self Care | Payer: No Typology Code available for payment source | Source: Ambulatory Visit | Attending: Nephrology | Admitting: Nephrology

## 2021-01-17 DIAGNOSIS — M549 Dorsalgia, unspecified: Secondary | ICD-10-CM

## 2021-01-17 DIAGNOSIS — R310 Gross hematuria: Secondary | ICD-10-CM

## 2021-03-05 ENCOUNTER — Other Ambulatory Visit: Payer: Self-pay | Admitting: Nephrology

## 2021-03-05 DIAGNOSIS — R310 Gross hematuria: Secondary | ICD-10-CM

## 2021-03-15 ENCOUNTER — Other Ambulatory Visit: Payer: No Typology Code available for payment source

## 2021-04-30 ENCOUNTER — Other Ambulatory Visit: Payer: Self-pay | Admitting: Nephrology

## 2021-04-30 DIAGNOSIS — E210 Primary hyperparathyroidism: Secondary | ICD-10-CM

## 2021-05-01 ENCOUNTER — Other Ambulatory Visit (HOSPITAL_COMMUNITY): Payer: Self-pay | Admitting: Nephrology

## 2021-05-01 ENCOUNTER — Other Ambulatory Visit: Payer: Self-pay | Admitting: Nephrology

## 2021-05-01 DIAGNOSIS — E213 Hyperparathyroidism, unspecified: Secondary | ICD-10-CM

## 2021-06-05 ENCOUNTER — Encounter
Admission: RE | Admit: 2021-06-05 | Discharge: 2021-06-05 | Disposition: A | Discharge status: Home / Self Care | Payer: No Typology Code available for payment source | Source: Ambulatory Visit | Attending: Nephrology | Admitting: Nephrology

## 2021-06-05 DIAGNOSIS — E213 Hyperparathyroidism, unspecified: Secondary | ICD-10-CM | POA: Diagnosis not present

## 2021-06-05 MED ORDER — TECHNETIUM TC 99M SESTAMIBI GENERIC - CARDIOLITE
24.6400 | Freq: Once | INTRAVENOUS | Status: AC | PRN
  Administered 2021-06-05: 24.64 via INTRAVENOUS

## 2021-07-22 ENCOUNTER — Other Ambulatory Visit: Payer: Self-pay | Admitting: Surgery

## 2021-07-22 DIAGNOSIS — E210 Primary hyperparathyroidism: Secondary | ICD-10-CM

## 2021-07-23 ENCOUNTER — Ambulatory Visit
Admission: RE | Admit: 2021-07-23 | Discharge: 2021-07-23 | Disposition: A | Discharge status: Home / Self Care | Payer: No Typology Code available for payment source | Source: Ambulatory Visit | Attending: Surgery | Admitting: Surgery

## 2021-07-23 DIAGNOSIS — E210 Primary hyperparathyroidism: Secondary | ICD-10-CM

## 2021-07-25 NOTE — Progress Notes (Signed)
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  This is good news and she will be a candidate for minimally invasive parathyroidectomy as an out-patient.  Would still like to see 24 hour urine results. ? ?There is a nodule in the thyroid that will require biopsy prior to surgery, and another nodule that will require follow up in one year with ultrasound. ? ?Claiborne Billings - please arrange USN-guided FNA biopsy of the left superior 1.5 cm nodule as noted on the ultrasound report. ? ?Armandina Gemma, MD ?[facility] ?A [facility] practice ?Office: [phone] ? ?

## 2021-08-02 ENCOUNTER — Other Ambulatory Visit: Payer: Self-pay | Admitting: Surgery

## 2021-08-02 DIAGNOSIS — E041 Nontoxic single thyroid nodule: Secondary | ICD-10-CM

## 2021-08-06 ENCOUNTER — Other Ambulatory Visit (HOSPITAL_COMMUNITY)
Admission: RE | Admit: 2021-08-06 | Discharge: 2021-08-06 | Disposition: A | Discharge status: Home / Self Care | Payer: No Typology Code available for payment source | Source: Ambulatory Visit | Attending: Surgery | Admitting: Surgery

## 2021-08-06 ENCOUNTER — Ambulatory Visit
Admission: RE | Admit: 2021-08-06 | Discharge: 2021-08-06 | Disposition: A | Discharge status: Home / Self Care | Payer: No Typology Code available for payment source | Source: Ambulatory Visit | Attending: Surgery | Admitting: Surgery

## 2021-08-06 DIAGNOSIS — E041 Nontoxic single thyroid nodule: Secondary | ICD-10-CM | POA: Insufficient documentation

## 2021-08-06 NOTE — Procedures (Signed)
Interventional Radiology Procedure Note ? ?Procedure: US guided FNA left superior thyroid nodule ?Complications: None ?EBL: None ?Recommendations: ?  ?- Routine wound care ?- Follow up pathology ?  ? ?Signed, ? ?Corrie Mckusick, DO ? ? ?

## 2021-08-07 LAB — CYTOLOGY - NON PAP

## 2021-08-07 NOTE — Progress Notes (Signed)
FNA biopsy with atypia, Bethesda category III.  Will be sent for [facility] testing.  Will take 10-14 days for results. ? ?tmg ? ?Armandina Gemma, MD ?[facility] ?A [facility] practice ?Office: [phone] ?

## 2021-08-22 ENCOUNTER — Ambulatory Visit: Payer: Self-pay | Admitting: Surgery

## 2021-08-23 ENCOUNTER — Encounter (HOSPITAL_COMMUNITY): Payer: Self-pay

## 2021-08-23 NOTE — Progress Notes (Signed)
AFIRMA report that we discussed yesterday at the office. ? ?tmg ? ?Armandina Gemma, MD ?[facility] ?A [facility] practice ?Office: [phone] ?

## 2021-09-23 NOTE — Progress Notes (Signed)
DUE TO COVID-19 ONLY  2 VISITOR IS ALLOWED TO COME WITH YOU AND STAY IN THE WAITING ROOM ONLY DURING PRE OP AND PROCEDURE DAY OF SURGERY.  4  VISITOR  MAY VISIT WITH YOU AFTER SURGERY IN YOUR PRIVATE ROOM DURING VISITING HOURS ONLY! YOU MAY HAVE ONE PERSON SPEND THE NITE WITH YOU IN YOUR ROOM AFTER SURGERY.        Your procedure is scheduled on:       10/03/21   Report to [facility] Main  Entrance   Report to admitting at   0845AM               AM DO NOT BRING INSURANCE CARD, PICTURE ID OR WALLET DAY OF SURGERY.      Call this number if you have problems the morning of surgery [phone]    REMEMBER: NO  SOLID FOODS , CANDY, GUM OR MINTS AFTER [place] .       Marland Kitchen CLEAR LIQUIDS UNTIL      0800AM           DAY OF SURGERY.      PLEASE FINISH ENSURE DRINK PER SURGEON ORDER  WHICH NEEDS TO BE COMPLETED AT   0800AM       MORNING OF SURGERY.       CLEAR LIQUID DIET   Foods Allowed      WATER BLACK COFFEE ( SUGAR OK, NO MILK, CREAM OR CREAMER) REGULAR AND DECAF  TEA ( SUGAR OK NO MILK, CREAM, OR CREAMER) REGULAR AND DECAF  PLAIN JELLO ( NO RED)  FRUIT ICES ( NO RED, NO FRUIT PULP)  POPSICLES ( NO RED)  JUICE- [place], WHITE GRAPE AND WHITE CRANBERRY  SPORT DRINK LIKE GATORADE ( NO RED)  CLEAR BROTH ( VEGETABLE , CHICKEN OR BEEF)                                                                     BRUSH YOUR TEETH MORNING OF SURGERY AND RINSE YOUR MOUTH OUT, NO CHEWING GUM CANDY OR MINTS.     Take these medicines the morning of surgery with A SIP OF WATER:  AMLODIOPINE, WELLBUTRIN, PEPCID, ZOLOFT , SYNTHROID    DO NOT TAKE ANY DIABETIC MEDICATIONS DAY OF YOUR SURGERY                               You may not have any metal on your body including hair pins and              piercings  Do not wear jewelry, make-up, lotions, powders or perfumes, deodorant             Do not wear nail polish on your fingernails.              IF YOU ARE A FEMALE AND WANT  TO SHAVE UNDER ARMS OR LEGS PRIOR TO SURGERY YOU MUST DO SO AT LEAST 48 HOURS PRIOR TO SURGERY.              Men may shave face and neck.   Do not bring valuables to the hospital. [facility] IS NOT  RESPONSIBLE   FOR VALUABLES.  Contacts, dentures or bridgework may not be worn into surgery.  Leave suitcase in the car. After surgery it may be brought to your room.     Patients discharged the day of surgery will not be allowed to drive home. IF YOU ARE HAVING SURGERY AND GOING HOME THE SAME DAY, YOU MUST HAVE AN ADULT TO DRIVE YOU HOME AND BE WITH YOU FOR 24 HOURS. YOU MAY GO HOME BY TAXI OR UBER OR ORTHERWISE, BUT AN ADULT MUST ACCOMPANY YOU HOME AND STAY WITH YOU FOR 24 HOURS.                Please read over the following fact sheets you were given: _____________________________________________________________________  [facility] - Preparing for Surgery Before surgery, you can play an important role.  Because skin is not sterile, your skin needs to be as free of germs as possible.  You can reduce the number of germs on your skin by washing with CHG (chlorahexidine gluconate) soap before surgery.  CHG is an antiseptic cleaner which kills germs and bonds with the skin to continue killing germs even after washing. Please DO NOT use if you have an allergy to CHG or antibacterial soaps.  If your skin becomes reddened/irritated stop using the CHG and inform your nurse when you arrive at Short Stay. Do not shave (including legs and underarms) for at least 48 hours prior to the first CHG shower.  You may shave your face/neck. Please follow these instructions carefully:  1.  Shower with CHG Soap the night before surgery and the  morning of Surgery.  2.  If you choose to wash your hair, wash your hair first as usual with your  normal  shampoo.  3.  After you shampoo, rinse your hair and body thoroughly to remove the  shampoo.                           4.  Use CHG as you would any other  liquid soap.  You can apply chg directly  to the skin and wash                       Gently with a scrungie or clean washcloth.  5.  Apply the CHG Soap to your body ONLY FROM THE NECK DOWN.   Do not use on face/ open                           Wound or open sores. Avoid contact with eyes, ears mouth and genitals (private parts).                       Wash face,  Genitals (private parts) with your normal soap.             6.  Wash thoroughly, paying special attention to the area where your surgery  will be performed.  7.  Thoroughly rinse your body with warm water from the neck down.  8.  DO NOT shower/wash with your normal soap after using and rinsing off  the CHG Soap.                9.  Pat yourself dry with a clean towel.            10.  Wear clean pajamas.  11.  Place clean sheets on your bed the night of your first shower and do not  sleep with pets. Day of Surgery : Do not apply any lotions/deodorants the morning of surgery.  Please wear clean clothes to the hospital/surgery center.  FAILURE TO FOLLOW THESE INSTRUCTIONS MAY RESULT IN THE CANCELLATION OF YOUR SURGERY PATIENT SIGNATURE_________________________________  NURSE SIGNATURE__________________________________  ________________________________________________________________________

## 2021-09-23 NOTE — Progress Notes (Addendum)
Anesthesia Review:  PCP: DR Rachell Cipro  Cardiologist : none  Chest x-ray : 09/25/21- CXR results final on 09/26/21.  Routed to DR Gerkin.  Shawn Stall made aware.  EKG : 09/25/21  Echo : Stress test: Cardiac Cath :  Activity level: can do a flight of stairs without difficulty  Sleep Study/ CPAP : none  Fasting Blood Sugar :      / Checks Blood Sugar -- times a day:   Blood Thinner/ Instructions /Last Dose: ASA / Instructions/ Last Dose :   81 MG aSPIRIN - pt has not yet received instructions- pt to call [facility] .

## 2021-09-25 ENCOUNTER — Ambulatory Visit (HOSPITAL_COMMUNITY)
Admission: RE | Admit: 2021-09-25 | Discharge: 2021-09-25 | Disposition: A | Discharge status: Home / Self Care | Payer: No Typology Code available for payment source | Source: Ambulatory Visit | Attending: Anesthesiology | Admitting: Anesthesiology

## 2021-09-25 ENCOUNTER — Encounter (HOSPITAL_COMMUNITY)
Admission: RE | Admit: 2021-09-25 | Discharge: 2021-09-25 | Disposition: A | Discharge status: Home / Self Care | Payer: No Typology Code available for payment source | Source: Ambulatory Visit | Attending: Surgery | Admitting: Surgery

## 2021-09-25 ENCOUNTER — Encounter (HOSPITAL_COMMUNITY): Payer: Self-pay

## 2021-09-25 ENCOUNTER — Other Ambulatory Visit: Payer: Self-pay

## 2021-09-25 VITALS — BP 122/72 | HR 75 | Temp 98.9°F | Resp 16 | Ht 69.0 in | Wt 206.0 lb

## 2021-09-25 DIAGNOSIS — Z01818 Encounter for other preprocedural examination: Secondary | ICD-10-CM | POA: Diagnosis present

## 2021-09-25 HISTORY — DX: Gastro-esophageal reflux disease without esophagitis: K21.9

## 2021-09-25 HISTORY — DX: Chronic kidney disease, unspecified: N18.9

## 2021-09-25 LAB — CBC
HCT: 41.9 % (ref 36.0–46.0)
Hemoglobin: 14.1 g/dL (ref 12.0–15.0)
MCH: 32.7 pg (ref 26.0–34.0)
MCHC: 33.7 g/dL (ref 30.0–36.0)
MCV: 97.2 fL (ref 80.0–100.0)
Platelets: 256 10*3/uL (ref 150–400)
RBC: 4.31 MIL/uL (ref 3.87–5.11)
RDW: 11.9 % (ref 11.5–15.5)
WBC: 7.9 10*3/uL (ref 4.0–10.5)
nRBC: 0 % (ref 0.0–0.2)

## 2021-09-25 LAB — BASIC METABOLIC PANEL
Anion gap: 4 — ABNORMAL LOW (ref 5–15)
BUN: 9 mg/dL (ref 6–20)
CO2: 27 mmol/L (ref 22–32)
Calcium: 10.4 mg/dL — ABNORMAL HIGH (ref 8.9–10.3)
Chloride: 106 mmol/L (ref 98–111)
Creatinine, Ser: 0.68 mg/dL (ref 0.44–1.00)
GFR, Estimated: >60 mL/min (ref >60)
Glucose, Bld: 84 mg/dL (ref 70–99)
Potassium: 4.5 mmol/L (ref 3.5–5.1)
Sodium: 137 mmol/L (ref 135–145)

## 2021-09-26 NOTE — Progress Notes (Signed)
Reviewed.  Will get CT chest per radiology recommendations pre-op.  tmg  Armandina Gemma, MD [facility] [place] practice Office: [phone]

## 2021-09-27 ENCOUNTER — Other Ambulatory Visit (HOSPITAL_COMMUNITY): Payer: Self-pay | Admitting: Surgery

## 2021-09-27 ENCOUNTER — Other Ambulatory Visit (HOSPITAL_BASED_OUTPATIENT_CLINIC_OR_DEPARTMENT_OTHER): Payer: Self-pay | Admitting: Surgery

## 2021-09-27 ENCOUNTER — Other Ambulatory Visit: Payer: Self-pay | Admitting: Surgery

## 2021-09-27 DIAGNOSIS — R9389 Abnormal findings on diagnostic imaging of other specified body structures: Secondary | ICD-10-CM

## 2021-09-27 DIAGNOSIS — E210 Primary hyperparathyroidism: Secondary | ICD-10-CM

## 2021-09-27 DIAGNOSIS — E213 Hyperparathyroidism, unspecified: Secondary | ICD-10-CM

## 2021-09-28 ENCOUNTER — Ambulatory Visit (HOSPITAL_BASED_OUTPATIENT_CLINIC_OR_DEPARTMENT_OTHER): Payer: No Typology Code available for payment source

## 2021-09-29 ENCOUNTER — Inpatient Hospital Stay (HOSPITAL_BASED_OUTPATIENT_CLINIC_OR_DEPARTMENT_OTHER): Admission: RE | Admit: 2021-09-29 | Payer: No Typology Code available for payment source | Source: Ambulatory Visit

## 2021-09-29 ENCOUNTER — Encounter (HOSPITAL_COMMUNITY): Payer: Self-pay | Admitting: Surgery

## 2021-09-29 ENCOUNTER — Ambulatory Visit (HOSPITAL_BASED_OUTPATIENT_CLINIC_OR_DEPARTMENT_OTHER)
Admission: RE | Admit: 2021-09-29 | Discharge: 2021-09-29 | Disposition: A | Discharge status: Home / Self Care | Payer: No Typology Code available for payment source | Source: Ambulatory Visit | Attending: Surgery | Admitting: Surgery

## 2021-09-29 DIAGNOSIS — E210 Primary hyperparathyroidism: Secondary | ICD-10-CM

## 2021-09-29 DIAGNOSIS — E042 Nontoxic multinodular goiter: Secondary | ICD-10-CM | POA: Diagnosis present

## 2021-09-29 DIAGNOSIS — D440 Neoplasm of uncertain behavior of thyroid gland: Secondary | ICD-10-CM | POA: Diagnosis present

## 2021-09-29 DIAGNOSIS — R9389 Abnormal findings on diagnostic imaging of other specified body structures: Secondary | ICD-10-CM | POA: Diagnosis present

## 2021-09-29 MED ORDER — IOHEXOL 300 MG/ML  SOLN
100.0000 mL | Freq: Once | INTRAMUSCULAR | Status: AC | PRN
  Administered 2021-09-29: 80 mL via INTRAVENOUS

## 2021-09-29 NOTE — H&P (Signed)
Chief Complaint: suspicious biopsy and Follow-up (Primary hyperparathyroidism, thyroid neoplasm of uncertain behavior.)   History of Present Illness:  Patient returns for follow-up having undergone evaluation for primary hyperparathyroidism. 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 The ultrasound however did identify multiple thyroid nodules. The left superior nodule was felt to be moderately suspicious and fine-needle aspiration biopsy was recommended. A right sided thyroid nodule was felt to require additional follow-up with repeat ultrasound in 1 year. Biopsy on the left sided nodule was carried out and returned with cytologic atypia, Bethesda category III. This was submitted for molecular genetic testing, AFIRMA, and returned with a result of suspicious, rendering a risk of malignancy of approximately 50%. The patient and her family come to the office today to review these results in detail and to discuss surgical options.   Review of Systems: A complete review of systems was obtained from the patient. I have reviewed this information and discussed as appropriate with the patient. See HPI as well for other ROS.  Review of Systems  Constitutional: Negative.  HENT: Negative.  Eyes: Negative.  Respiratory: Negative.  Cardiovascular: Negative.  Gastrointestinal: Negative.  Genitourinary: Negative.  Musculoskeletal: Negative.  Skin: Negative.  Neurological: Negative.  Endo/Heme/Allergies: Negative.  Psychiatric/Behavioral: Negative.    Medical History: Past Medical History:  Diagnosis Date   Aneurysm (CMS-HCC)   Chronic kidney disease   Hypertension   Patient Active Problem List  Diagnosis   Primary hyperparathyroidism (CMS-HCC)   Multiple thyroid nodules   Neoplasm of uncertain behavior of thyroid gland   Past Surgical History:  Procedure Laterality Date   CESAREAN DELIVERY   colonscopy    No Known Allergies  Current Outpatient Medications  on File Prior to Visit  Medication Sig Dispense Refill   amLODIPine (NORVASC) 5 MG tablet Take 5 mg by mouth at bedtime   aspirin 81 mg Cap Aspirin 81 mg   buPROPion (WELLBUTRIN XL) 150 MG XL tablet bupropion HCl XL 150 mg 24 hr tablet, extended release   famotidine (PEPCID) 10 MG tablet Take 20 mg by mouth   folic acid 0.8 mg Cap Folic acid   levothyroxine (SYNTHROID) 25 MCG tablet levothyroxine 25 mcg tablet   loratadine (CLARITIN) 10 mg capsule Take 10 mg by mouth once daily   melatonin 10 mg Tab Take by mouth at bedtime as needed   montelukast (SINGULAIR) 10 mg tablet   sertraline (ZOLOFT) 50 MG tablet sertraline 50 mg tablet   No current facility-administered medications on file prior to visit.   Family History  Problem Relation Age of Onset   High blood pressure (Hypertension) Mother   Heart valve disease Mother   Diabetes Mother   Skin cancer Father   Heart valve disease Father   Skin cancer Sister   Heart valve disease Sister    Social History   Tobacco Use  Smoking Status Every Day   Types: Cigarettes  Smokeless Tobacco Never    Social History   Socioeconomic History   Marital status: Divorced  Tobacco Use   Smoking status: Every Day  Types: Cigarettes   Smokeless tobacco: Never  Substance and Sexual Activity   Alcohol use: Yes   Drug use: Never   Objective:   There were no vitals filed for this visit.  There is no height or weight on file to calculate BMI.  Physical Exam   Limited examination  Anterior neck is symmetric. Palpation reveals subtle nodularity bilaterally. There is no associated lymphadenopathy. Voice quality  is normal.   Assessment and Plan:   Primary hyperparathyroidism (CMS-HCC)  Multiple thyroid nodules  Neoplasm of uncertain behavior of thyroid gland  Today we reviewed the results of her diagnostic studies. In particular, we spent time reviewing the cytopathology and molecular genetic testing from the left upper lobe  thyroid nodule. We also reviewed the ultrasound results with bilateral thyroid nodules noted.  Today we discussed proceeding with parathyroidectomy for removal of the left inferior parathyroid adenoma. We also discussed performing either left thyroid lobectomy in order to remove the thyroid neoplasm of uncertain behavior, or proceeding with total thyroidectomy in order to remove all thyroid nodules. We discussed the risk and benefits of the surgery including the risk of recurrent nerve injury and injury to parathyroid glands. We discussed the size and location of the surgical incision. We discussed the hospital stay to be anticipated. We discussed her postoperative recovery. Patient already takes levothyroxine we discussed the fact that she would likely require a higher dosage postoperatively.  After discussion, the patient would like to proceed with total thyroidectomy in order to evaluate the thyroid neoplasm of uncertain behavior in the left thyroid lobe and to eliminate the risk of other thyroid nodules which are present. We will also perform parathyroidectomy concurrently. We will make arrangements for this procedure in the near future at a time convenient for the patient.   Armandina Gemma, MD [facility] [place] practice Office: [phone]

## 2021-10-01 NOTE — Progress Notes (Signed)
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  All good news!  Plan to proceed with scheduled surgery.  [place], MD [facility] [place] practice Office: [phone]

## 2021-10-03 ENCOUNTER — Ambulatory Visit (HOSPITAL_BASED_OUTPATIENT_CLINIC_OR_DEPARTMENT_OTHER): Payer: No Typology Code available for payment source | Admitting: Certified Registered Nurse Anesthetist

## 2021-10-03 ENCOUNTER — Ambulatory Visit (HOSPITAL_COMMUNITY): Payer: No Typology Code available for payment source | Admitting: Physician Assistant

## 2021-10-03 ENCOUNTER — Ambulatory Visit (HOSPITAL_COMMUNITY)
Admission: RE | Admit: 2021-10-03 | Discharge: 2021-10-04 | Disposition: A | Discharge status: Home / Self Care | Payer: No Typology Code available for payment source | Attending: Surgery | Admitting: Surgery

## 2021-10-03 ENCOUNTER — Encounter (HOSPITAL_COMMUNITY)
Admission: RE | Disposition: A | Discharge status: Home / Self Care | Payer: Self-pay | Source: Home / Self Care | Attending: Surgery

## 2021-10-03 ENCOUNTER — Other Ambulatory Visit: Payer: Self-pay

## 2021-10-03 ENCOUNTER — Encounter (HOSPITAL_COMMUNITY): Payer: Self-pay | Admitting: Surgery

## 2021-10-03 DIAGNOSIS — E063 Autoimmune thyroiditis: Secondary | ICD-10-CM | POA: Diagnosis not present

## 2021-10-03 DIAGNOSIS — E210 Primary hyperparathyroidism: Secondary | ICD-10-CM

## 2021-10-03 DIAGNOSIS — D440 Neoplasm of uncertain behavior of thyroid gland: Secondary | ICD-10-CM | POA: Diagnosis present

## 2021-10-03 DIAGNOSIS — K219 Gastro-esophageal reflux disease without esophagitis: Secondary | ICD-10-CM | POA: Diagnosis not present

## 2021-10-03 DIAGNOSIS — Z01818 Encounter for other preprocedural examination: Secondary | ICD-10-CM

## 2021-10-03 DIAGNOSIS — D351 Benign neoplasm of parathyroid gland: Secondary | ICD-10-CM | POA: Insufficient documentation

## 2021-10-03 DIAGNOSIS — N189 Chronic kidney disease, unspecified: Secondary | ICD-10-CM | POA: Diagnosis not present

## 2021-10-03 DIAGNOSIS — E042 Nontoxic multinodular goiter: Secondary | ICD-10-CM | POA: Diagnosis present

## 2021-10-03 DIAGNOSIS — F17210 Nicotine dependence, cigarettes, uncomplicated: Secondary | ICD-10-CM | POA: Diagnosis not present

## 2021-10-03 DIAGNOSIS — I129 Hypertensive chronic kidney disease with stage 1 through stage 4 chronic kidney disease, or unspecified chronic kidney disease: Secondary | ICD-10-CM | POA: Diagnosis not present

## 2021-10-03 HISTORY — PX: THYROIDECTOMY: SHX17

## 2021-10-03 HISTORY — PX: PARATHYROIDECTOMY: SHX19

## 2021-10-03 LAB — PREGNANCY, URINE: Preg Test, Ur: NEGATIVE

## 2021-10-03 SURGERY — PARATHYROIDECTOMY
Anesthesia: General

## 2021-10-03 MED ORDER — MIDAZOLAM HCL 5 MG/5ML IJ SOLN
INTRAMUSCULAR | Status: DC | PRN
  Administered 2021-10-03: 2 mg via INTRAVENOUS

## 2021-10-03 MED ORDER — LACTATED RINGERS IV SOLN: INTRAVENOUS | Status: DC

## 2021-10-03 MED ORDER — SUGAMMADEX SODIUM 200 MG/2ML IV SOLN
INTRAVENOUS | Status: DC | PRN
  Administered 2021-10-03: 200 mg via INTRAVENOUS

## 2021-10-03 MED ORDER — ORAL CARE MOUTH RINSE: 15.0000 mL | Freq: Once | OROMUCOSAL | Status: AC

## 2021-10-03 MED ORDER — FENTANYL CITRATE (PF) 100 MCG/2ML IJ SOLN
INTRAMUSCULAR | Status: DC | PRN
  Administered 2021-10-03: 50 ug via INTRAVENOUS
  Administered 2021-10-03: 100 ug via INTRAVENOUS
  Administered 2021-10-03 (×2): 50 ug via INTRAVENOUS

## 2021-10-03 MED ORDER — ONDANSETRON 4 MG PO TBDP: 4.0000 mg | ORAL_TABLET | Freq: Four times a day (QID) | ORAL | Status: DC | PRN

## 2021-10-03 MED ORDER — SODIUM CHLORIDE 0.45 % IV SOLN: INTRAVENOUS | Status: DC

## 2021-10-03 MED ORDER — ROCURONIUM BROMIDE 10 MG/ML (PF) SYRINGE
PREFILLED_SYRINGE | INTRAVENOUS | Status: DC | PRN
  Administered 2021-10-03: 100 mg via INTRAVENOUS

## 2021-10-03 MED ORDER — HEMOSTATIC AGENTS (NO CHARGE) OPTIME
TOPICAL | Status: DC | PRN
  Administered 2021-10-03: 1

## 2021-10-03 MED ORDER — IRBESARTAN 150 MG PO TABS
300.0000 mg | ORAL_TABLET | Freq: Every day | ORAL | Status: DC
  Administered 2021-10-03 – 2021-10-04 (×2): 300 mg via ORAL
  Filled 2021-10-03 (×2): qty 2

## 2021-10-03 MED ORDER — LIDOCAINE HCL (PF) 2 % IJ SOLN
INTRAMUSCULAR | Status: AC
  Filled 2021-10-03: qty 5

## 2021-10-03 MED ORDER — ACETAMINOPHEN 325 MG PO TABS
650.0000 mg | ORAL_TABLET | Freq: Four times a day (QID) | ORAL | Status: DC | PRN
  Administered 2021-10-03: 650 mg via ORAL
  Filled 2021-10-03: qty 2

## 2021-10-03 MED ORDER — EPHEDRINE SULFATE-NACL 50-0.9 MG/10ML-% IV SOSY
PREFILLED_SYRINGE | INTRAVENOUS | Status: DC | PRN
  Administered 2021-10-03 (×2): 5 mg via INTRAVENOUS

## 2021-10-03 MED ORDER — ACETAMINOPHEN 500 MG PO TABS
1000.0000 mg | ORAL_TABLET | Freq: Once | ORAL | Status: AC
  Administered 2021-10-03: 1000 mg via ORAL
  Filled 2021-10-03: qty 2

## 2021-10-03 MED ORDER — PROPOFOL 10 MG/ML IV BOLUS
INTRAVENOUS | Status: AC
  Filled 2021-10-03: qty 20

## 2021-10-03 MED ORDER — EPHEDRINE 5 MG/ML INJ
INTRAVENOUS | Status: AC
  Filled 2021-10-03: qty 5

## 2021-10-03 MED ORDER — LIDOCAINE 2% (20 MG/ML) 5 ML SYRINGE
INTRAMUSCULAR | Status: DC | PRN
  Administered 2021-10-03: 60 mg via INTRAVENOUS

## 2021-10-03 MED ORDER — FENTANYL CITRATE (PF) 250 MCG/5ML IJ SOLN
INTRAMUSCULAR | Status: AC
  Filled 2021-10-03: qty 5

## 2021-10-03 MED ORDER — MELATONIN 5 MG PO TABS
10.0000 mg | ORAL_TABLET | Freq: Every evening | ORAL | Status: DC | PRN
Start: 2021-10-03 — End: 2021-10-04

## 2021-10-03 MED ORDER — DEXAMETHASONE SODIUM PHOSPHATE 4 MG/ML IJ SOLN
INTRAMUSCULAR | Status: DC | PRN
  Administered 2021-10-03: 8 mg via INTRAVENOUS

## 2021-10-03 MED ORDER — PROPOFOL 10 MG/ML IV BOLUS
INTRAVENOUS | Status: DC | PRN
  Administered 2021-10-03: 60 mg via INTRAVENOUS
  Administered 2021-10-03: 140 mg via INTRAVENOUS

## 2021-10-03 MED ORDER — 0.9 % SODIUM CHLORIDE (POUR BTL) OPTIME
TOPICAL | Status: DC | PRN
  Administered 2021-10-03: 1000 mL

## 2021-10-03 MED ORDER — PHENYLEPHRINE 80 MCG/ML (10ML) SYRINGE FOR IV PUSH (FOR BLOOD PRESSURE SUPPORT)
PREFILLED_SYRINGE | INTRAVENOUS | Status: AC
Start: 2021-10-03 — End: ?
  Filled 2021-10-03: qty 10

## 2021-10-03 MED ORDER — TRAMADOL HCL 50 MG PO TABS
50.0000 mg | ORAL_TABLET | Freq: Four times a day (QID) | ORAL | Status: DC | PRN
  Administered 2021-10-03 – 2021-10-04 (×2): 50 mg via ORAL
  Filled 2021-10-03 (×2): qty 1

## 2021-10-03 MED ORDER — MONTELUKAST SODIUM 10 MG PO TABS
10.0000 mg | ORAL_TABLET | Freq: Every day | ORAL | Status: DC
  Administered 2021-10-03: 10 mg via ORAL
  Filled 2021-10-03: qty 1

## 2021-10-03 MED ORDER — ONDANSETRON HCL 4 MG/2ML IJ SOLN
INTRAMUSCULAR | Status: AC
  Filled 2021-10-03: qty 2

## 2021-10-03 MED ORDER — SERTRALINE HCL 50 MG PO TABS
50.0000 mg | ORAL_TABLET | Freq: Every day | ORAL | Status: DC
  Administered 2021-10-04: 50 mg via ORAL
  Filled 2021-10-03: qty 1

## 2021-10-03 MED ORDER — FENTANYL CITRATE PF 50 MCG/ML IJ SOSY
PREFILLED_SYRINGE | INTRAMUSCULAR | Status: AC
  Filled 2021-10-03: qty 1

## 2021-10-03 MED ORDER — DEXAMETHASONE SODIUM PHOSPHATE 10 MG/ML IJ SOLN
INTRAMUSCULAR | Status: AC
  Filled 2021-10-03: qty 1

## 2021-10-03 MED ORDER — BUPIVACAINE HCL (PF) 0.25 % IJ SOLN
INTRAMUSCULAR | Status: DC | PRN
  Administered 2021-10-03: 10 mL

## 2021-10-03 MED ORDER — ONDANSETRON HCL 4 MG/2ML IJ SOLN: 4.0000 mg | Freq: Four times a day (QID) | INTRAMUSCULAR | Status: DC | PRN

## 2021-10-03 MED ORDER — MIDAZOLAM HCL 2 MG/2ML IJ SOLN
INTRAMUSCULAR | Status: AC
  Filled 2021-10-03: qty 2

## 2021-10-03 MED ORDER — HYDROMORPHONE HCL 1 MG/ML IJ SOLN: 1.0000 mg | INTRAMUSCULAR | Status: DC | PRN

## 2021-10-03 MED ORDER — BUPROPION HCL ER (XL) 150 MG PO TB24
150.0000 mg | ORAL_TABLET | Freq: Every morning | ORAL | Status: DC
  Administered 2021-10-04: 150 mg via ORAL
  Filled 2021-10-03: qty 1

## 2021-10-03 MED ORDER — CHLORHEXIDINE GLUCONATE CLOTH 2 % EX PADS: 6.0000 | MEDICATED_PAD | Freq: Once | CUTANEOUS | Status: DC

## 2021-10-03 MED ORDER — AMLODIPINE BESYLATE 5 MG PO TABS
5.0000 mg | ORAL_TABLET | Freq: Every day | ORAL | Status: DC
  Administered 2021-10-03 – 2021-10-04 (×2): 5 mg via ORAL
  Filled 2021-10-03 (×2): qty 1

## 2021-10-03 MED ORDER — FAMOTIDINE 20 MG PO TABS
20.0000 mg | ORAL_TABLET | Freq: Every day | ORAL | Status: DC
  Administered 2021-10-04: 20 mg via ORAL
  Filled 2021-10-03: qty 1

## 2021-10-03 MED ORDER — PHENYLEPHRINE 80 MCG/ML (10ML) SYRINGE FOR IV PUSH (FOR BLOOD PRESSURE SUPPORT)
PREFILLED_SYRINGE | INTRAVENOUS | Status: DC | PRN
  Administered 2021-10-03 (×2): 80 ug via INTRAVENOUS
  Administered 2021-10-03: 120 ug via INTRAVENOUS

## 2021-10-03 MED ORDER — OXYCODONE HCL 5 MG PO TABS
5.0000 mg | ORAL_TABLET | ORAL | Status: DC | PRN
  Administered 2021-10-03 – 2021-10-04 (×4): 10 mg via ORAL
  Filled 2021-10-03 (×4): qty 2

## 2021-10-03 MED ORDER — CHLORHEXIDINE GLUCONATE 0.12 % MT SOLN
15.0000 mL | Freq: Once | OROMUCOSAL | Status: AC
  Administered 2021-10-03: 15 mL via OROMUCOSAL

## 2021-10-03 MED ORDER — FENTANYL CITRATE PF 50 MCG/ML IJ SOSY
25.0000 ug | PREFILLED_SYRINGE | INTRAMUSCULAR | Status: DC | PRN
  Administered 2021-10-03 (×3): 50 ug via INTRAVENOUS

## 2021-10-03 MED ORDER — ROCURONIUM BROMIDE 10 MG/ML (PF) SYRINGE
PREFILLED_SYRINGE | INTRAVENOUS | Status: AC
Start: 2021-10-03 — End: ?
  Filled 2021-10-03: qty 10

## 2021-10-03 MED ORDER — ACETAMINOPHEN 650 MG RE SUPP: 650.0000 mg | Freq: Four times a day (QID) | RECTAL | Status: DC | PRN

## 2021-10-03 MED ORDER — BUPIVACAINE HCL (PF) 0.25 % IJ SOLN
INTRAMUSCULAR | Status: AC
  Filled 2021-10-03: qty 30

## 2021-10-03 MED ORDER — MELATONIN 10 MG PO TABS: 10.0000 mg | ORAL_TABLET | Freq: Every evening | ORAL | Status: DC | PRN

## 2021-10-03 MED ORDER — CIPROFLOXACIN IN D5W 400 MG/200ML IV SOLN
400.0000 mg | INTRAVENOUS | Status: AC
  Administered 2021-10-03: 400 mg via INTRAVENOUS
  Filled 2021-10-03: qty 200

## 2021-10-03 MED ORDER — ONDANSETRON HCL 4 MG/2ML IJ SOLN
INTRAMUSCULAR | Status: DC | PRN
  Administered 2021-10-03: 4 mg via INTRAVENOUS

## 2021-10-03 SURGICAL SUPPLY — 40 items
ADH SKN CLS APL DERMABOND .7 (GAUZE/BANDAGES/DRESSINGS) ×2
APL PRP STRL LF DISP 70% ISPRP (MISCELLANEOUS) ×2
ATTRACTOMAT 16X20 MAGNETIC DRP (DRAPES) ×4 IMPLANT
BAG COUNTER SPONGE SURGICOUNT (BAG) ×4 IMPLANT
BAG SPNG CNTER NS LX DISP (BAG) ×2
BLADE SURG 15 STRL LF DISP TIS (BLADE) ×3 IMPLANT
BLADE SURG 15 STRL SS (BLADE) ×3
CHLORAPREP W/TINT 26 (MISCELLANEOUS) ×4 IMPLANT
CLIP TI MEDIUM 6 (CLIP) ×9 IMPLANT
CLIP TI WIDE RED SMALL 6 (CLIP) ×10 IMPLANT
COVER SURGICAL LIGHT HANDLE (MISCELLANEOUS) ×4 IMPLANT
DERMABOND ADVANCED (GAUZE/BANDAGES/DRESSINGS) ×1
DERMABOND ADVANCED .7 DNX12 (GAUZE/BANDAGES/DRESSINGS) ×3 IMPLANT
DRAPE LAPAROTOMY T 98X78 PEDS (DRAPES) ×4 IMPLANT
DRAPE UTILITY XL STRL (DRAPES) ×4 IMPLANT
ELECT PENCIL ROCKER SW 15FT (MISCELLANEOUS) ×4 IMPLANT
ELECT REM PT RETURN 15FT ADLT (MISCELLANEOUS) ×4 IMPLANT
GAUZE 4X4 16PLY ~~LOC~~+RFID DBL (SPONGE) ×4 IMPLANT
GLOVE SURG ORTHO 8.0 STRL STRW (GLOVE) ×4 IMPLANT
GLOVE SURG SYN 7.5  E (GLOVE) ×9
GLOVE SURG SYN 7.5 E (GLOVE) ×6 IMPLANT
GLOVE SURG SYN 7.5 PF PI (GLOVE) ×9 IMPLANT
GOWN STRL REUS W/ TWL XL LVL3 (GOWN DISPOSABLE) ×9 IMPLANT
GOWN STRL REUS W/TWL XL LVL3 (GOWN DISPOSABLE) ×9
HEMOSTAT SURGICEL 2X4 FIBR (HEMOSTASIS) ×4 IMPLANT
ILLUMINATOR WAVEGUIDE N/F (MISCELLANEOUS) ×4 IMPLANT
KIT BASIN OR (CUSTOM PROCEDURE TRAY) ×4 IMPLANT
KIT TURNOVER KIT A (KITS) IMPLANT
NDL HYPO 25X1 1.5 SAFETY (NEEDLE) ×3 IMPLANT
NEEDLE HYPO 25X1 1.5 SAFETY (NEEDLE) ×3 IMPLANT
PACK BASIC VI WITH GOWN DISP (CUSTOM PROCEDURE TRAY) ×4 IMPLANT
PENCIL SMOKE EVACUATOR (MISCELLANEOUS) ×3 IMPLANT
SHEARS HARMONIC 9CM CVD (BLADE) ×4 IMPLANT
SUT MNCRL AB 4-0 PS2 18 (SUTURE) ×4 IMPLANT
SUT VIC AB 3-0 SH 18 (SUTURE) ×8 IMPLANT
SYR BULB IRRIG 60ML STRL (SYRINGE) ×4 IMPLANT
SYR CONTROL 10ML LL (SYRINGE) ×4 IMPLANT
TOWEL OR 17X26 10 PK STRL BLUE (TOWEL DISPOSABLE) ×4 IMPLANT
TOWEL OR NON WOVEN STRL DISP B (DISPOSABLE) ×4 IMPLANT
TUBING CONNECTING 10 (TUBING) ×4 IMPLANT

## 2021-10-03 NOTE — Anesthesia Postprocedure Evaluation (Signed)
Anesthesia Post Note  Patient: Paula Fernandez  Procedure(s) Performed: LEFT SUPERIOR PARATHYROIDECTOMY (Left) TOTAL THYROIDECTOMY     Patient location during evaluation: PACU Anesthesia Type: General Level of consciousness: awake and alert Pain management: pain level controlled Vital Signs Assessment: post-procedure vital signs reviewed and stable Respiratory status: spontaneous breathing, nonlabored ventilation, respiratory function stable and patient connected to nasal cannula oxygen Cardiovascular status: blood pressure returned to baseline and stable Postop Assessment: no apparent nausea or vomiting Anesthetic complications: no   No notable events documented.  Last Vitals:  Vitals:   10/03/21 1600 10/03/21 1654  BP: (!) 129/92 (!) 134/110  Pulse: (!) 51 (!) 59  Resp: 13 16  Temp:  36.5 C  SpO2: 97% 99%    Last Pain:  Vitals:   10/03/21 1654  TempSrc: Oral  PainSc:                  Kamsiyochukwu Buist L Javed Cotto

## 2021-10-03 NOTE — Interval H&P Note (Signed)
History and Physical Interval Note:  10/03/2021 10:54 AM  Paula Fernandez  has presented today for surgery, with the diagnosis of PRIMARY HYPERPARATHYROIDISM, THYROID NEOPLASM OF UNCERTAIN BEHAVIOR, MULTIPLE THYROID NODULES.  The various methods of treatment have been discussed with the patient and family. After consideration of risks, benefits and other options for treatment, the patient has consented to    Procedure(s): LEFT INFERIOR PARATHYROIDECTOMY (Left) TOTAL THYROIDECTOMY (N/A) as a surgical intervention.    The patient's history has been reviewed, patient examined, no change in status, stable for surgery.  I have reviewed the patient's chart and labs.  Questions were answered to the patient's satisfaction.    Armandina Gemma, [facility] [place] practice Office: [place]

## 2021-10-03 NOTE — Op Note (Signed)
Procedure Note  Pre-operative Diagnosis:  primary hyperparathyroidism, thyroid neoplasm of uncertain behavior, multiple thyroid nodules  Post-operative Diagnosis:  same  Surgeon:  Armandina Gemma, MD  Assistant:  Mohammed Kindle, RN   Procedure:  1. Total thyroidectomy  2. Right superior parathyroidectomy  Anesthesia:  General  Estimated Blood Loss:  minimal  Drains: none         Specimen: thyroid to pathology  Indications:  Patient returns for follow-up having undergone evaluation for primary hyperparathyroidism. 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 The ultrasound however did identify multiple thyroid nodules. The left superior nodule was felt to be moderately suspicious and fine-needle aspiration biopsy was recommended. A right sided thyroid nodule was felt to require additional follow-up with repeat ultrasound in 1 year. Biopsy on the left sided nodule was carried out and returned with cytologic atypia, Bethesda category III. This was submitted for molecular genetic testing, AFIRMA, and returned with a result of suspicious, rendering a risk of malignancy of approximately 50%. The patient now comes to surgery for parathyroidectomy and total thyroidectomy.  Procedure Details: Procedure was done in OR #1 at the [facility]. The patient was brought to the operating room and placed in a supine position on the operating room table. Following administration of general anesthesia, the patient was positioned and then prepped and draped in the usual aseptic fashion. After ascertaining that an adequate level of anesthesia had been achieved, a small Kocher incision was made with #15 blade. Dissection was carried through subcutaneous tissues and platysma.Hemostasis was achieved with the electrocautery. Skin flaps were elevated cephalad and caudad from the thyroid notch to the sternal notch. A Mahorner self-retaining retractor was placed for exposure. Strap muscles were incised  in the midline and dissection was begun on the left side.  Strap muscles were reflected laterally.  Left thyroid lobe was mildly enlarged, firm, with a central nodule.  The left lobe was gently mobilized with blunt dissection. Superior pole vessels were dissected out and divided individually between small and medium ligaclips with the harmonic scalpel. The thyroid lobe was rolled anteriorly. Branches of the inferior thyroid artery were divided between small ligaclips with the harmonic scalpel. Inferior venous tributaries were divided between ligaclips. The recurrent laryngeal nerve was identified and preserved along its course. The ligament of Gwenlyn Found was released with the electrocautery and the gland was mobilized onto the anterior trachea. Isthmus was mobilized across the midline. There was a small pyramidal lobe present which was resected and submitted with the specimen. Dry pack was placed in the left neck.  The right thyroid lobe was gently mobilized with blunt dissection. Right thyroid lobe was normal in size, firm, and irregularly shaped. Superior pole vessels were dissected out and divided between small and medium ligaclips with the Harmonic scalpel. Superior parathyroid was identified and preserved. Inferior venous tributaries were divided between medium ligaclips with the harmonic scalpel. The right thyroid lobe was rolled anteriorly and the branches of the inferior thyroid artery divided between small ligaclips. The right recurrent laryngeal nerve was identified and preserved along its course. The ligament of Gwenlyn Found was released with the electrocautery. The right thyroid lobe was mobilized onto the anterior trachea and the remainder of the thyroid was dissected off the anterior trachea and the thyroid was completely excised. A suture was used to mark the left lobe. The entire thyroid gland was submitted to pathology for review.  On the left side of the neck there was a palpable mass located posteriorly  alongside  the esophagus.  This was gently dissected out and mobilized.  The vascular pedicle originated superiorly consistent with an elongated superior parathyroid adenoma.  It measured approximately 3 cm in length.  Vascular pedicle was divided between ligaclips with the harmonic scalpel and the gland was excised.  It was submitted to pathology where frozen section confirmed hypercellular parathyroid tissue consistent with adenoma.  The neck was irrigated with warm saline. Fibrillar was placed throughout the operative field. Strap muscles were approximated in the midline with interrupted 3-0 Vicryl sutures. Platysma was closed with interrupted 3-0 Vicryl sutures. Skin was closed with a running 4-0 Monocryl subcuticular suture. Wound was washed and Dermabond was applied. The patient was awakened from anesthesia and brought to the recovery room. The patient tolerated the procedure well.   Armandina Gemma, MD [facility] Office: [phone]

## 2021-10-03 NOTE — Transfer of Care (Signed)
Immediate Anesthesia Transfer of Care Note  Patient: Paula Fernandez  Procedure(s) Performed: LEFT SUPERIOR PARATHYROIDECTOMY (Left) TOTAL THYROIDECTOMY  Patient Location: PACU  Anesthesia Type:General  Level of Consciousness: awake and patient cooperative  Airway & Oxygen Therapy: Patient Spontanous Breathing and Patient connected to face mask  Post-op Assessment: Report given to RN and Post -op Vital signs reviewed and stable  Post vital signs: Reviewed and stable  Last Vitals:  Vitals Value Taken Time  BP 136/89 10/03/21 1306  Temp    Pulse 78 10/03/21 1307  Resp 11 10/03/21 1307  SpO2 100 % 10/03/21 1307  Vitals shown include unvalidated device data.  Last Pain:  Vitals:   10/03/21 0925  TempSrc:   PainSc: 0-No pain         Complications: No notable events documented.

## 2021-10-03 NOTE — Anesthesia Procedure Notes (Signed)
Procedure Name: Intubation Date/Time: 10/03/2021 11:15 AM  Performed by: Claudia Desanctis, CRNAPre-anesthesia Checklist: Patient identified, Emergency Drugs available, Suction available and Patient being monitored Patient Re-evaluated:Patient Re-evaluated prior to induction Oxygen Delivery Method: Circle system utilized Preoxygenation: Pre-oxygenation with 100% oxygen Induction Type: IV induction Ventilation: Mask ventilation without difficulty Laryngoscope Size: 2 and Miller Grade View: Grade II Tube type: Oral Tube size: 7.0 mm Number of attempts: 1 Airway Equipment and Method: Stylet Placement Confirmation: ETT inserted through vocal cords under direct vision, positive ETCO2 and breath sounds checked- equal and bilateral Secured at: 21 cm Tube secured with: Tape Dental Injury: Teeth and Oropharynx as per pre-operative assessment

## 2021-10-03 NOTE — Anesthesia Preprocedure Evaluation (Addendum)
Anesthesia Evaluation  Patient identified by MRN, date of birth, ID band Patient awake    Reviewed: Allergy & Precautions, NPO status , Patient's Chart, lab work & pertinent test results  Airway Mallampati: III  TM Distance: >3 FB Neck ROM: Full  Mouth opening: Limited Mouth Opening  Dental no notable dental hx. (+) Teeth Intact, Dental Advisory Given   Pulmonary Current SmokerPatient did not abstain from smoking.,    Pulmonary exam normal breath sounds clear to auscultation       Cardiovascular hypertension, Pt. on medications Normal cardiovascular exam Rhythm:Regular Rate:Normal     Neuro/Psych negative neurological ROS  negative psych ROS   GI/Hepatic Neg liver ROS, GERD  ,  Endo/Other  Hypothyroidism   Renal/GU Renal InsufficiencyRenal disease  negative genitourinary   Musculoskeletal negative musculoskeletal ROS (+)   Abdominal   Peds  Hematology negative hematology ROS (+)   Anesthesia Other Findings   Reproductive/Obstetrics                            Anesthesia Physical Anesthesia Plan  ASA: 2  Anesthesia Plan: General   Post-op Pain Management: Tylenol PO (pre-op)*   Induction: Intravenous  PONV Risk Score and Plan: 2 and Midazolam, Dexamethasone and Ondansetron  Airway Management Planned: Oral ETT  Additional Equipment:   Intra-op Plan:   Post-operative Plan: Extubation in OR  Informed Consent: I have reviewed the patients History and Physical, chart, labs and discussed the procedure including the risks, benefits and alternatives for the proposed anesthesia with the patient or authorized representative who has indicated his/her understanding and acceptance.     Dental advisory given  Plan Discussed with: CRNA  Anesthesia Plan Comments:         Anesthesia Quick Evaluation

## 2021-10-04 ENCOUNTER — Encounter (HOSPITAL_COMMUNITY): Payer: Self-pay | Admitting: Surgery

## 2021-10-04 DIAGNOSIS — E210 Primary hyperparathyroidism: Secondary | ICD-10-CM | POA: Diagnosis not present

## 2021-10-04 LAB — BASIC METABOLIC PANEL
Anion gap: 8 (ref 5–15)
BUN: 14 mg/dL (ref 6–20)
CO2: 25 mmol/L (ref 22–32)
Calcium: 9.3 mg/dL (ref 8.9–10.3)
Chloride: 106 mmol/L (ref 98–111)
Creatinine, Ser: 0.75 mg/dL (ref 0.44–1.00)
GFR, Estimated: >60 mL/min (ref >60)
Glucose, Bld: 123 mg/dL — ABNORMAL HIGH (ref 70–99)
Potassium: 4.3 mmol/L (ref 3.5–5.1)
Sodium: 139 mmol/L (ref 135–145)

## 2021-10-04 MED ORDER — OXYCODONE HCL 5 MG PO TABS: 5.0000 mg | ORAL_TABLET | Freq: Four times a day (QID) | ORAL | 0 refills | Status: DC | PRN

## 2021-10-04 MED ORDER — LEVOTHYROXINE SODIUM 100 MCG PO TABS: 100.0000 ug | ORAL_TABLET | Freq: Every day | ORAL | 2 refills | Status: DC

## 2021-10-04 MED ORDER — PHENYLEPHRINE HCL-NACL 20-0.9 MG/250ML-% IV SOLN
INTRAVENOUS | Status: AC
  Filled 2021-10-04: qty 500

## 2021-10-08 LAB — SURGICAL PATHOLOGY

## 2021-10-13 NOTE — Progress Notes (Signed)
     Patient had a thyroid neoplasm of uncertain behavior.  Final pathology shows adenomatous nodules.  Thus the issue is resolved.  She no longer has a neoplasm of uncertain behavior.  We now know that it is an adenomatous nodule.  Armandina Gemma, MD [facility] [place] practice Office: [phone]

## 2021-10-14 NOTE — Progress Notes (Signed)
Good news!  Pathology is all benign and there was a large parathyroid adenoma.  Received message about your symptoms.  Will get calcium level checked today.  [place], MD [facility] [place] practice Office: [phone]

## 2021-10-24 DIAGNOSIS — E892 Postprocedural hypoparathyroidism: Secondary | ICD-10-CM | POA: Insufficient documentation

## 2021-12-05 DIAGNOSIS — N83209 Unspecified ovarian cyst, unspecified side: Secondary | ICD-10-CM | POA: Insufficient documentation

## 2022-02-20 ENCOUNTER — Ambulatory Visit: Payer: Managed Care, Other (non HMO) | Admitting: Family

## 2022-05-28 ENCOUNTER — Other Ambulatory Visit: Payer: Self-pay | Admitting: Family Medicine

## 2022-05-28 DIAGNOSIS — Z1231 Encounter for screening mammogram for malignant neoplasm of breast: Secondary | ICD-10-CM

## 2022-05-30 ENCOUNTER — Ambulatory Visit
Admission: RE | Admit: 2022-05-30 | Discharge: 2022-05-30 | Disposition: A | Discharge status: Home / Self Care | Payer: Managed Care, Other (non HMO) | Source: Ambulatory Visit | Attending: Family Medicine | Admitting: Family Medicine

## 2022-05-30 DIAGNOSIS — Z1231 Encounter for screening mammogram for malignant neoplasm of breast: Secondary | ICD-10-CM

## 2022-06-17 ENCOUNTER — Ambulatory Visit: Payer: Managed Care, Other (non HMO) | Admitting: Podiatry

## 2022-06-17 ENCOUNTER — Encounter: Payer: Self-pay | Admitting: Podiatry

## 2022-06-17 ENCOUNTER — Ambulatory Visit (INDEPENDENT_AMBULATORY_CARE_PROVIDER_SITE_OTHER): Payer: Managed Care, Other (non HMO)

## 2022-06-17 DIAGNOSIS — R1013 Epigastric pain: Secondary | ICD-10-CM | POA: Insufficient documentation

## 2022-06-17 DIAGNOSIS — M722 Plantar fascial fibromatosis: Secondary | ICD-10-CM | POA: Diagnosis not present

## 2022-06-17 DIAGNOSIS — K219 Gastro-esophageal reflux disease without esophagitis: Secondary | ICD-10-CM | POA: Insufficient documentation

## 2022-06-17 DIAGNOSIS — R142 Eructation: Secondary | ICD-10-CM | POA: Insufficient documentation

## 2022-06-17 MED ORDER — MELOXICAM 15 MG PO TABS: 15.0000 mg | ORAL_TABLET | Freq: Every day | ORAL | 3 refills | Status: AC

## 2022-06-17 MED ORDER — METHYLPREDNISOLONE 4 MG PO TBPK: ORAL_TABLET | ORAL | 0 refills | Status: DC

## 2022-06-17 MED ORDER — TRIAMCINOLONE ACETONIDE 40 MG/ML IJ SUSP
20.0000 mg | Freq: Once | INTRAMUSCULAR | Status: AC
  Administered 2022-06-17: 20 mg

## 2022-06-17 NOTE — Patient Instructions (Signed)

## 2022-06-17 NOTE — Progress Notes (Signed)
Subjective:  Patient ID: Paula Fernandez, female    DOB: 1971-02-21,  MRN: [id number] HPI Chief Complaint  Patient presents with   Foot Pain    Plantar heel left - aching x 1 month, AM pain, constant now, tried Tylenol   New Patient (Initial Visit)    52 y.o. female presents with the above complaint.   ROS: Denies fever chills nausea vomit muscle aches pains calf pain back pain chest pain shortness of breath.  Past Medical History:  Diagnosis Date   Chronic kidney disease    GERD (gastroesophageal reflux disease)    Heartburn    Hypertension    Thyroid disease    Past Surgical History:  Procedure Laterality Date   CESAREAN SECTION  2000   PARATHYROIDECTOMY Left 10/03/2021   Procedure: LEFT SUPERIOR PARATHYROIDECTOMY;  Surgeon: Armandina Gemma, MD;  Location: [facility]S;  Service: General;  Laterality: Left;   THYROIDECTOMY N/A 10/03/2021   Procedure: TOTAL THYROIDECTOMY;  Surgeon: Armandina Gemma, MD;  Location: [facility] ORS;  Service: General;  Laterality: N/A;    Current Outpatient Medications:    amLODipine (NORVASC) 5 MG tablet, Take 5 mg by mouth daily., Disp: , Rfl:    aspirin 81 MG EC tablet, Take 81 mg by mouth daily., Disp: , Rfl:    buPROPion (WELLBUTRIN XL) 150 MG 24 hr tablet, Take 150 mg by mouth every morning., Disp: , Rfl:    famotidine (PEPCID) 10 MG tablet, Take 20 mg by mouth daily., Disp: , Rfl:    folic acid (FOLVITE) 1 MG tablet, Take 1 mg by mouth daily., Disp: , Rfl:    levothyroxine (SYNTHROID) 200 MCG tablet, Take 200 mcg by mouth daily before breakfast., Disp: , Rfl:    loratadine (CLARITIN) 10 MG tablet, Take 10 mg by mouth at bedtime., Disp: , Rfl:    Melatonin 10 MG TABS, Take 10 mg by mouth at bedtime as needed (sleep)., Disp: , Rfl:    meloxicam (MOBIC) 15 MG tablet, Take 1 tablet (15 mg total) by mouth daily., Disp: 30 tablet, Rfl: 3   methylPREDNISolone (MEDROL DOSEPAK) 4 MG TBPK tablet, 6 day dose pack - take as directed, Disp: 21 tablet, Rfl: 0   NATURAL  VITAMIN D-3 125 MCG (5000 UT) TABS, Take 1 tablet by mouth daily., Disp: , Rfl:    olmesartan (BENICAR) 40 MG tablet, Take 40 mg by mouth daily., Disp: , Rfl:    sertraline (ZOLOFT) 50 MG tablet, Take 50 mg by mouth daily., Disp: , Rfl:   Allergies  Allergen Reactions   Zosyn [Piperacillin Sod-Tazobactam So] Itching   Lisinopril Cough   Review of Systems Objective:  There were no vitals filed for this visit.  General: Well developed, nourished, in no acute distress, alert and oriented x3   Dermatological: Skin is warm, dry and supple bilateral. Nails x 10 are well maintained; remaining integument appears unremarkable at this time. There are no open sores, no preulcerative lesions, no rash or signs of infection present.  Vascular: Dorsalis Pedis artery and Posterior Tibial artery pedal pulses are 2/4 bilateral with immedate capillary fill time. Pedal hair growth present. No varicosities and no lower extremity edema present bilateral.   Neruologic: Grossly intact via light touch bilateral. Vibratory intact via tuning fork bilateral. Protective threshold with Semmes Wienstein monofilament intact to all pedal sites bilateral. Patellar and Achilles deep tendon reflexes 2+ bilateral. No Babinski or clonus noted bilateral.   Musculoskeletal: No gross boney pedal deformities bilateral. No pain, crepitus, or  limitation noted with foot and ankle range of motion bilateral. Muscular strength 5/5 in all groups tested bilateral.  Pain on palpation medial calcaneal tubercle of the left heel.  No pain on medial and lateral compression of the calcaneus.  Gait: Unassisted, Nonantalgic.    Radiographs:  Radiographs taken today demonstrate osseously mature individual with soft tissue increase in density plantar fascial cannula insertion site consistent with fasciitis.  Assessment & Plan:   Assessment: Planter fasciitis left foot.  Plan: Discussed etiology pathology conservative versus surgical  therapies.  At this point injected the left heel 20 mg Kenalog 5 mg Marcaine start her on methylprednisolone to be followed by meloxicam.  Discussed appropriate shoe gear stretching excise ice therapy sugar modifications we will follow-up with her in 1 month     Shyrl Obi T. [place], [place]

## 2022-06-20 ENCOUNTER — Other Ambulatory Visit: Payer: Self-pay | Admitting: Obstetrics

## 2022-06-20 DIAGNOSIS — N920 Excessive and frequent menstruation with regular cycle: Secondary | ICD-10-CM

## 2022-07-07 ENCOUNTER — Ambulatory Visit
Admission: RE | Admit: 2022-07-07 | Discharge: 2022-07-07 | Disposition: A | Discharge status: Home / Self Care | Payer: Managed Care, Other (non HMO) | Source: Ambulatory Visit | Attending: Obstetrics | Admitting: Obstetrics

## 2022-07-07 DIAGNOSIS — N920 Excessive and frequent menstruation with regular cycle: Secondary | ICD-10-CM

## 2022-07-22 ENCOUNTER — Ambulatory Visit: Payer: Managed Care, Other (non HMO) | Admitting: Podiatry

## 2022-07-22 ENCOUNTER — Encounter: Payer: Self-pay | Admitting: Podiatry

## 2022-07-22 DIAGNOSIS — M722 Plantar fascial fibromatosis: Secondary | ICD-10-CM | POA: Diagnosis not present

## 2022-07-22 MED ORDER — TRIAMCINOLONE ACETONIDE 40 MG/ML IJ SUSP
20.0000 mg | Freq: Once | INTRAMUSCULAR | Status: AC
  Administered 2022-07-22: 20 mg

## 2022-07-22 NOTE — Progress Notes (Signed)
she presents today for follow-up of her Planter fasciitis states that is still sore but is much better than it was.  Objective: Vital signs are stable she is alert and oriented x 3 pulses are palpable.  Pain on palpation medial calcaneal tubercles.   Assessment: Planter fasciitis resolving.  Plan: Reinjected the heel 20 mg Kenalog 5 mg Marcaine continue all other conservative therapies follow-up with her in 6 weeks

## 2022-09-02 ENCOUNTER — Ambulatory Visit: Payer: Managed Care, Other (non HMO) | Admitting: Podiatry

## 2022-10-27 ENCOUNTER — Ambulatory Visit: Payer: Managed Care, Other (non HMO) | Admitting: Family Medicine

## 2022-11-24 DIAGNOSIS — E039 Hypothyroidism, unspecified: Secondary | ICD-10-CM | POA: Diagnosis not present

## 2022-11-24 DIAGNOSIS — N951 Menopausal and female climacteric states: Secondary | ICD-10-CM | POA: Diagnosis not present

## 2022-11-27 DIAGNOSIS — F411 Generalized anxiety disorder: Secondary | ICD-10-CM | POA: Diagnosis not present

## 2022-11-27 DIAGNOSIS — E039 Hypothyroidism, unspecified: Secondary | ICD-10-CM | POA: Diagnosis not present

## 2022-11-27 DIAGNOSIS — G4700 Insomnia, unspecified: Secondary | ICD-10-CM | POA: Diagnosis not present

## 2022-11-27 DIAGNOSIS — F331 Major depressive disorder, recurrent, moderate: Secondary | ICD-10-CM | POA: Diagnosis not present

## 2022-12-11 ENCOUNTER — Ambulatory Visit: Payer: Managed Care, Other (non HMO) | Admitting: Family

## 2022-12-31 DIAGNOSIS — N951 Menopausal and female climacteric states: Secondary | ICD-10-CM | POA: Diagnosis not present

## 2022-12-31 DIAGNOSIS — Z7185 Encounter for immunization safety counseling: Secondary | ICD-10-CM | POA: Diagnosis not present

## 2022-12-31 DIAGNOSIS — I10 Essential (primary) hypertension: Secondary | ICD-10-CM | POA: Diagnosis not present

## 2023-01-02 ENCOUNTER — Emergency Department (HOSPITAL_BASED_OUTPATIENT_CLINIC_OR_DEPARTMENT_OTHER): Payer: BC Managed Care – PPO | Admitting: Radiology

## 2023-01-02 ENCOUNTER — Encounter (HOSPITAL_BASED_OUTPATIENT_CLINIC_OR_DEPARTMENT_OTHER): Payer: Self-pay | Admitting: Emergency Medicine

## 2023-01-02 ENCOUNTER — Emergency Department (HOSPITAL_BASED_OUTPATIENT_CLINIC_OR_DEPARTMENT_OTHER): Payer: BC Managed Care – PPO

## 2023-01-02 ENCOUNTER — Emergency Department (HOSPITAL_BASED_OUTPATIENT_CLINIC_OR_DEPARTMENT_OTHER)
Admission: EM | Admit: 2023-01-02 | Discharge: 2023-01-02 | Disposition: A | Discharge status: Home / Self Care | Payer: BC Managed Care – PPO | Attending: Emergency Medicine | Admitting: Emergency Medicine

## 2023-01-02 ENCOUNTER — Other Ambulatory Visit: Payer: Self-pay

## 2023-01-02 DIAGNOSIS — Z79899 Other long term (current) drug therapy: Secondary | ICD-10-CM | POA: Insufficient documentation

## 2023-01-02 DIAGNOSIS — K30 Functional dyspepsia: Secondary | ICD-10-CM | POA: Insufficient documentation

## 2023-01-02 DIAGNOSIS — R9389 Abnormal findings on diagnostic imaging of other specified body structures: Secondary | ICD-10-CM | POA: Diagnosis not present

## 2023-01-02 DIAGNOSIS — R079 Chest pain, unspecified: Secondary | ICD-10-CM | POA: Diagnosis not present

## 2023-01-02 DIAGNOSIS — Z7982 Long term (current) use of aspirin: Secondary | ICD-10-CM | POA: Insufficient documentation

## 2023-01-02 DIAGNOSIS — R142 Eructation: Secondary | ICD-10-CM | POA: Diagnosis not present

## 2023-01-02 DIAGNOSIS — R0789 Other chest pain: Secondary | ICD-10-CM | POA: Diagnosis not present

## 2023-01-02 DIAGNOSIS — R0989 Other specified symptoms and signs involving the circulatory and respiratory systems: Secondary | ICD-10-CM | POA: Diagnosis not present

## 2023-01-02 DIAGNOSIS — I10 Essential (primary) hypertension: Secondary | ICD-10-CM | POA: Diagnosis not present

## 2023-01-02 LAB — CBC
HCT: 45.6 % (ref 36.0–46.0)
Hemoglobin: 16.1 g/dL — ABNORMAL HIGH (ref 12.0–15.0)
MCH: 31.5 pg (ref 26.0–34.0)
MCHC: 35.3 g/dL (ref 30.0–36.0)
MCV: 89.2 fL (ref 80.0–100.0)
Platelets: 265 10*3/uL (ref 150–400)
RBC: 5.11 MIL/uL (ref 3.87–5.11)
RDW: 11.4 % — ABNORMAL LOW (ref 11.5–15.5)
WBC: 8.7 10*3/uL (ref 4.0–10.5)
nRBC: 0 % (ref 0.0–0.2)

## 2023-01-02 LAB — BASIC METABOLIC PANEL
Anion gap: 10 (ref 5–15)
BUN: 15 mg/dL (ref 6–20)
CO2: 29 mmol/L (ref 22–32)
Calcium: 10.4 mg/dL — ABNORMAL HIGH (ref 8.9–10.3)
Chloride: 99 mmol/L (ref 98–111)
Creatinine, Ser: 0.92 mg/dL (ref 0.44–1.00)
GFR, Estimated: >60 mL/min (ref >60)
Glucose, Bld: 94 mg/dL (ref 70–99)
Potassium: 4.3 mmol/L (ref 3.5–5.1)
Sodium: 138 mmol/L (ref 135–145)

## 2023-01-02 LAB — HEPATIC FUNCTION PANEL
ALT: 25 U/L (ref 0–44)
AST: 20 U/L (ref 15–41)
Albumin: 4.6 g/dL (ref 3.5–5.0)
Alkaline Phosphatase: 51 U/L (ref 38–126)
Bilirubin, Direct: 0.1 mg/dL (ref 0.0–0.2)
Indirect Bilirubin: 0.5 mg/dL (ref 0.3–0.9)
Total Bilirubin: 0.6 mg/dL (ref 0.3–1.2)
Total Protein: 7.4 g/dL (ref 6.5–8.1)

## 2023-01-02 LAB — TROPONIN I (HIGH SENSITIVITY)
Troponin I (High Sensitivity): 2 ng/L (ref <18)
Troponin I (High Sensitivity): 3 ng/L (ref <18)

## 2023-01-02 LAB — LIPASE, BLOOD: Lipase: 28 U/L (ref 11–51)

## 2023-01-02 MED ORDER — PANTOPRAZOLE SODIUM 40 MG IV SOLR
40.0000 mg | Freq: Once | INTRAVENOUS | Status: AC
  Administered 2023-01-02: 40 mg via INTRAVENOUS
  Filled 2023-01-02: qty 10

## 2023-01-02 MED ORDER — PANTOPRAZOLE SODIUM 20 MG PO TBEC: 20.0000 mg | DELAYED_RELEASE_TABLET | Freq: Every day | ORAL | 0 refills | Status: AC

## 2023-01-02 MED ORDER — ALUM & MAG HYDROXIDE-SIMETH 200-200-20 MG/5ML PO SUSP
30.0000 mL | Freq: Once | ORAL | Status: AC
  Administered 2023-01-02: 30 mL via ORAL

## 2023-01-02 MED ORDER — SIMETHICONE 125 MG PO CAPS: 125.0000 mg | ORAL_CAPSULE | Freq: Four times a day (QID) | ORAL | 0 refills | Status: AC | PRN

## 2023-01-02 MED ORDER — SIMETHICONE 40 MG/0.6ML PO SUSP (UNIT DOSE)
120.0000 mg | Freq: Once | ORAL | Status: AC
  Administered 2023-01-02: 120 mg via ORAL
  Filled 2023-01-02: qty 1.8

## 2023-01-02 NOTE — ED Notes (Signed)
ED Provider at bedside. 

## 2023-01-02 NOTE — Discharge Instructions (Signed)
Please follow-up with your primary care doctor, use the simethicone tablets, to help with your gas, and I recommend you stop drinking any kind of sodas, or any carbonated beverages, to help improve your gas.  Additionally if you have worsening chest pain, back pain, intractable nausea or vomiting return to the ER.  If he stopped being able to have a bowel movement also return to the ER

## 2023-01-02 NOTE — ED Triage Notes (Signed)
Pt reports indigestion and constant burping and CP since yesterday. Has tried tums, mylanta, milk, and other home remedies.  Has not vomited.  Some shob.

## 2023-01-02 NOTE — ED Provider Notes (Signed)
[place] EMERGENCY DEPARTMENT AT [facility] Provider Note   CSN: [id number] Arrival date & time: 01/02/23  1752     History  Chief Complaint  Patient presents with   Chest Pain    Paula Fernandez is a 52 y.o. female, history of hypertension, GERD, who presents to the ED secondary to belching x 1 day.  She states she ate a tuna melt yesterday, and since then she has had retrosternal pain, as well as increased belching.  She has vomited a few times, but did have a bowel movement this morning. States she just feels like pressure on her chest, denies radiation of the pain.  Has tried Mylanta, Tums, without success.    Home Medications Prior to Admission medications   Medication Sig Start Date End Date Taking? Authorizing Provider  pantoprazole (PROTONIX) 20 MG tablet Take 1 tablet (20 mg total) by mouth daily. 01/02/23  Yes Janyth Riera L, PA  Simethicone 125 MG CAPS Take 1 capsule (125 mg total) by mouth 4 (four) times daily as needed. 01/02/23  Yes Jozlyn Schatz L, PA  amLODipine (NORVASC) 5 MG tablet Take 5 mg by mouth daily. 09/08/21   [provider]  aspirin 81 MG EC tablet Take 81 mg by mouth daily.    [provider]  buPROPion (WELLBUTRIN XL) 150 MG 24 hr tablet Take 150 mg by mouth every morning. 05/04/20   [provider]  folic acid (FOLVITE) 1 MG tablet Take 1 mg by mouth daily.    [provider]  levothyroxine (SYNTHROID) 200 MCG tablet Take 200 mcg by mouth daily before breakfast.    [provider]  loratadine (CLARITIN) 10 MG tablet Take 10 mg by mouth at bedtime.    [provider]  Melatonin 10 MG TABS Take 10 mg by mouth at bedtime as needed (sleep).    [provider]  meloxicam (MOBIC) 15 MG tablet Take 1 tablet (15 mg total) by mouth daily. 06/17/22   Hyatt, Max T, DPM  NATURAL VITAMIN D-3 125 MCG (5000 UT) TABS Take 1 tablet by mouth daily. 05/21/22   [provider]  olmesartan (BENICAR) 40  MG tablet Take 40 mg by mouth daily. 05/07/22   [provider]  sertraline (ZOLOFT) 50 MG tablet Take 50 mg by mouth daily. 05/24/20   [provider]      Allergies    Zosyn [piperacillin sod-tazobactam so] and Lisinopril    Review of Systems   Review of Systems  Constitutional:  Negative for fever.  Respiratory:  Negative for shortness of breath.   Cardiovascular:  Positive for chest pain.    Physical Exam Updated Vital Signs BP 107/87   Pulse 67   Temp 98.1 F (36.7 C)   Resp 18   SpO2 98%  Physical Exam Vitals and nursing note reviewed.  Constitutional:      General: She is not in acute distress.    Appearance: She is well-developed.  HENT:     Head: Normocephalic and atraumatic.  Eyes:     Conjunctiva/sclera: Conjunctivae normal.  Cardiovascular:     Rate and Rhythm: Normal rate and regular rhythm.     Heart sounds: No murmur heard. Pulmonary:     Effort: Pulmonary effort is normal. No respiratory distress.     Breath sounds: Normal breath sounds.  Chest:     Comments: Tenderness to palpation of chest wall Abdominal:     Palpations: Abdomen is soft.  Tenderness: There is no abdominal tenderness.  Musculoskeletal:        General: No swelling.     Cervical back: Neck supple.  Skin:    General: Skin is warm and dry.     Capillary Refill: Capillary refill takes less than 2 seconds.  Neurological:     Mental Status: She is alert.  Psychiatric:        Mood and Affect: Mood normal.     ED Results / Procedures / Treatments   Labs (all labs ordered are listed, but only abnormal results are displayed) Labs Reviewed  BASIC METABOLIC PANEL - Abnormal; Notable for the following components:      Result Value   Calcium 10.4 (*)    All other components within normal limits  CBC - Abnormal; Notable for the following components:   Hemoglobin 16.1 (*)    RDW 11.4 (*)    All other components within normal limits  LIPASE, BLOOD  HEPATIC FUNCTION  PANEL  PREGNANCY, URINE  TROPONIN I (HIGH SENSITIVITY)  TROPONIN I (HIGH SENSITIVITY)    EKG None  Radiology DG Abdomen 1 View  Result Date: 01/02/2023 CLINICAL DATA:  Constant burping with chest pain. EXAM: ABDOMEN - 1 VIEW COMPARISON:  None Available. FINDINGS: The bowel gas pattern is normal. No radio-opaque calculi or other significant radiographic abnormality are seen. A radiopaque IUD is in place. IMPRESSION: Negative. Electronically Signed   By: Aram Candela M.D.   On: 01/02/2023 22:24   DG Chest 2 View  Result Date: 01/02/2023 CLINICAL DATA:  Chest pain. EXAM: CHEST - 2 VIEW COMPARISON:  September 25, 2021 FINDINGS: The heart size and mediastinal contours are within normal limits. Low lung volumes are seen with mild elevation of the right hemidiaphragm. Both lungs are clear. A Kelby Adell chronic appearing sclerotic area, representing adjacent chronic deformities of the first and second right ribs, is again seen overlying the right upper lobe. The visualized skeletal structures are otherwise unremarkable. IMPRESSION: No active cardiopulmonary disease. Electronically Signed   By: Aram Candela M.D.   On: 01/02/2023 20:56    Procedures Procedures    Medications Ordered in ED Medications  alum & mag hydroxide-simeth (MAALOX/MYLANTA) 200-200-20 MG/5ML suspension 30 mL (30 mLs Oral Given 01/02/23 1828)  pantoprazole (PROTONIX) injection 40 mg (40 mg Intravenous Given 01/02/23 2034)  simethicone (MYLICON) 40 mg/0.54ml suspension 120 mg (120 mg Oral Given 01/02/23 2034)    ED Course/ Medical Decision Making/ A&P                                 Medical Decision Making Patient is a 52 year old female, here for increased belching, and chest discomfort that occurred yesterday after eating a tuna melt.  States since then she has been constantly burping, and feels some pressure in her chest.  Will obtain troponins, chest x-ray as well as a KUB, to rule out any kind of obstruction.   Well-appearing at this time, belching.  In no acute distress  Amount and/or Complexity of Data Reviewed Labs: ordered.    Details: Troponin x 2 flat, no acute lab abnormalities, lipase negative Radiology: ordered.    Details: Chest x-ray and KUB within normal limits Discussion of management or test interpretation with external provider(s): Patient feeling little bit better after the simethicone, and pantoprazole.  I am suspicious that this may just be secondary to her indigestion, her EKG shows normal sinus rhythm.  And she  has normal troponins.  She just states she drinks several bubbly Cokes a day, and that she did eat a fatty sandwich, which precipitated this.  She has no right upper quadrant pain, or LFT elevation.  Thus I think this is likely secondary to indigestion.  Sent home on pantoprazole, and simethicone for symptomatic treatment.  Discussed return precautions, chest pain improving  Risk OTC drugs. Prescription drug management.   Final Clinical Impression(s) / ED Diagnoses Final diagnoses:  Belching  Indigestion  Chest pressure    Rx / DC Orders ED Discharge Orders          Ordered    pantoprazole (PROTONIX) 20 MG tablet  Daily        01/02/23 2244    Simethicone 125 MG CAPS  4 times daily PRN        01/02/23 2244              Mccade Sullenberger, Harley [place], PA 01/02/23 2336    Rondel Baton, MD 01/04/23 1017

## 2023-01-06 DIAGNOSIS — R079 Chest pain, unspecified: Secondary | ICD-10-CM | POA: Diagnosis not present

## 2023-01-06 DIAGNOSIS — K219 Gastro-esophageal reflux disease without esophagitis: Secondary | ICD-10-CM | POA: Diagnosis not present

## 2023-01-06 DIAGNOSIS — L0100 Impetigo, unspecified: Secondary | ICD-10-CM | POA: Diagnosis not present

## 2023-01-21 DIAGNOSIS — N181 Chronic kidney disease, stage 1: Secondary | ICD-10-CM | POA: Diagnosis not present

## 2023-01-22 DIAGNOSIS — N181 Chronic kidney disease, stage 1: Secondary | ICD-10-CM | POA: Diagnosis not present

## 2023-01-22 DIAGNOSIS — E210 Primary hyperparathyroidism: Secondary | ICD-10-CM | POA: Diagnosis not present

## 2023-01-22 DIAGNOSIS — I129 Hypertensive chronic kidney disease with stage 1 through stage 4 chronic kidney disease, or unspecified chronic kidney disease: Secondary | ICD-10-CM | POA: Diagnosis not present

## 2023-01-22 DIAGNOSIS — E039 Hypothyroidism, unspecified: Secondary | ICD-10-CM | POA: Diagnosis not present

## 2023-01-29 DIAGNOSIS — I10 Essential (primary) hypertension: Secondary | ICD-10-CM | POA: Diagnosis not present

## 2023-01-29 DIAGNOSIS — E039 Hypothyroidism, unspecified: Secondary | ICD-10-CM | POA: Diagnosis not present

## 2023-01-30 DIAGNOSIS — E039 Hypothyroidism, unspecified: Secondary | ICD-10-CM | POA: Diagnosis not present

## 2023-03-02 ENCOUNTER — Encounter: Payer: Self-pay | Admitting: Podiatry

## 2023-03-02 ENCOUNTER — Ambulatory Visit: Payer: BC Managed Care – PPO | Admitting: Podiatry

## 2023-03-02 DIAGNOSIS — M722 Plantar fascial fibromatosis: Secondary | ICD-10-CM

## 2023-03-02 MED ORDER — TRIAMCINOLONE ACETONIDE 40 MG/ML IJ SUSP
20.0000 mg | Freq: Once | INTRAMUSCULAR | Status: AC
  Administered 2023-03-02: 20 mg

## 2023-03-02 MED ORDER — METHYLPREDNISOLONE 4 MG PO TBPK: ORAL_TABLET | ORAL | 0 refills | Status: AC

## 2023-03-02 NOTE — Progress Notes (Signed)
She presents today for follow-up of her plantar fasciitis of right foot states that has been bother me for the past 2 weeks or so after like it may know what I been doing.  Objective: Vital signs are stable she is alert and oriented x 3.  Pulses are palpable.  There is no erythema edema cellulitis drainage or odor she has pain on palpation medial calcaneal tubercle of the right heel.  Assessment: Plantar fasciitis right.  Plan: We injected the right heel today started on methylprednisolone I have placed her in a plantar fascial brace.  Discussed appropriate shoe gear stretching sizes ice therapy and shoe gear modifications I would like to follow-up with her in 1 month

## 2023-03-03 DIAGNOSIS — M722 Plantar fascial fibromatosis: Secondary | ICD-10-CM | POA: Diagnosis not present

## 2023-03-03 DIAGNOSIS — I10 Essential (primary) hypertension: Secondary | ICD-10-CM | POA: Diagnosis not present

## 2023-03-03 DIAGNOSIS — K219 Gastro-esophageal reflux disease without esophagitis: Secondary | ICD-10-CM | POA: Diagnosis not present

## 2023-03-09 DIAGNOSIS — N9489 Other specified conditions associated with female genital organs and menstrual cycle: Secondary | ICD-10-CM | POA: Diagnosis not present

## 2023-03-30 DIAGNOSIS — E039 Hypothyroidism, unspecified: Secondary | ICD-10-CM | POA: Diagnosis not present

## 2023-03-31 DIAGNOSIS — I10 Essential (primary) hypertension: Secondary | ICD-10-CM | POA: Diagnosis not present

## 2023-03-31 DIAGNOSIS — M722 Plantar fascial fibromatosis: Secondary | ICD-10-CM | POA: Diagnosis not present

## 2023-03-31 DIAGNOSIS — K219 Gastro-esophageal reflux disease without esophagitis: Secondary | ICD-10-CM | POA: Diagnosis not present

## 2023-03-31 DIAGNOSIS — E039 Hypothyroidism, unspecified: Secondary | ICD-10-CM | POA: Diagnosis not present

## 2023-04-23 ENCOUNTER — Other Ambulatory Visit: Payer: Self-pay | Admitting: Family Medicine

## 2023-04-23 DIAGNOSIS — Z1231 Encounter for screening mammogram for malignant neoplasm of breast: Secondary | ICD-10-CM

## 2023-05-01 DIAGNOSIS — Z0000 Encounter for general adult medical examination without abnormal findings: Secondary | ICD-10-CM | POA: Diagnosis not present

## 2023-05-04 DIAGNOSIS — G4700 Insomnia, unspecified: Secondary | ICD-10-CM | POA: Diagnosis not present

## 2023-05-04 DIAGNOSIS — I10 Essential (primary) hypertension: Secondary | ICD-10-CM | POA: Diagnosis not present

## 2023-05-04 DIAGNOSIS — E039 Hypothyroidism, unspecified: Secondary | ICD-10-CM | POA: Diagnosis not present

## 2023-05-04 DIAGNOSIS — E781 Pure hyperglyceridemia: Secondary | ICD-10-CM | POA: Diagnosis not present

## 2023-06-04 DIAGNOSIS — E039 Hypothyroidism, unspecified: Secondary | ICD-10-CM | POA: Diagnosis not present

## 2023-06-04 DIAGNOSIS — I10 Essential (primary) hypertension: Secondary | ICD-10-CM | POA: Diagnosis not present

## 2023-06-04 DIAGNOSIS — E559 Vitamin D deficiency, unspecified: Secondary | ICD-10-CM | POA: Diagnosis not present

## 2023-06-04 DIAGNOSIS — K219 Gastro-esophageal reflux disease without esophagitis: Secondary | ICD-10-CM | POA: Diagnosis not present

## 2023-06-05 DIAGNOSIS — R5383 Other fatigue: Secondary | ICD-10-CM | POA: Diagnosis not present

## 2023-06-05 DIAGNOSIS — Z0000 Encounter for general adult medical examination without abnormal findings: Secondary | ICD-10-CM | POA: Diagnosis not present

## 2023-06-09 ENCOUNTER — Ambulatory Visit
Admission: RE | Admit: 2023-06-09 | Discharge: 2023-06-09 | Disposition: A | Discharge status: Home / Self Care | Payer: BC Managed Care – PPO | Source: Ambulatory Visit | Attending: Family Medicine | Admitting: Family Medicine

## 2023-06-09 DIAGNOSIS — Z1231 Encounter for screening mammogram for malignant neoplasm of breast: Secondary | ICD-10-CM | POA: Diagnosis not present

## 2023-06-09 DIAGNOSIS — Z0000 Encounter for general adult medical examination without abnormal findings: Secondary | ICD-10-CM | POA: Diagnosis not present

## 2023-06-09 DIAGNOSIS — Z23 Encounter for immunization: Secondary | ICD-10-CM | POA: Diagnosis not present

## 2023-06-20 IMAGING — US US RENAL
1 series · 14 of 25 positions shown · non-contrast
Comparison: Ultrasound 11/27/2017, MRI 12/09/2017

CLINICAL DATA: Gross hematuria with back pain

EXAM:
RENAL / URINARY TRACT ULTRASOUND COMPLETE

[Series 1: us renal · 0.23mm/px · 14 of 44 slices shown]
[im 1/44]
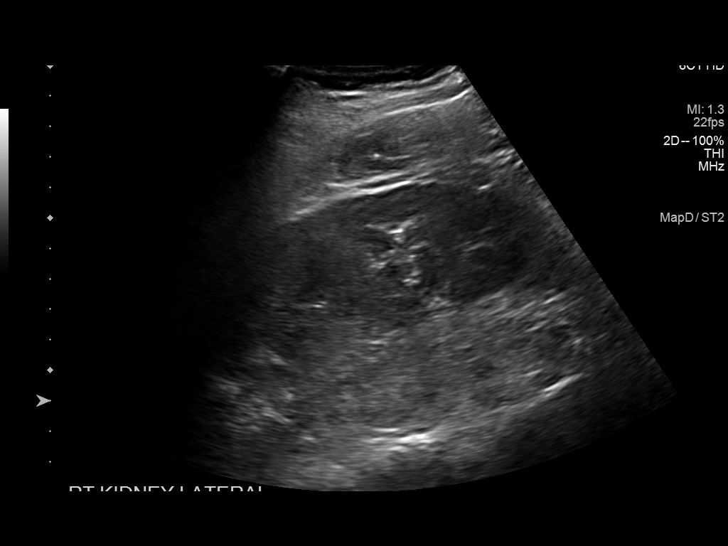
[im 4/44]
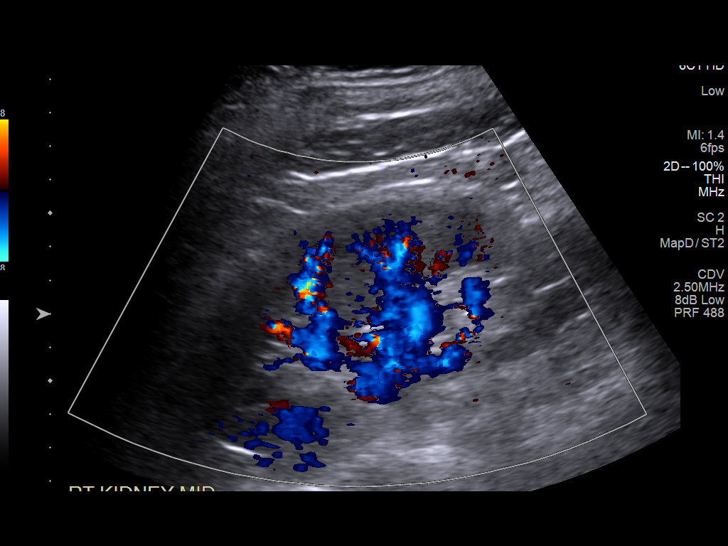
[im 8/44]
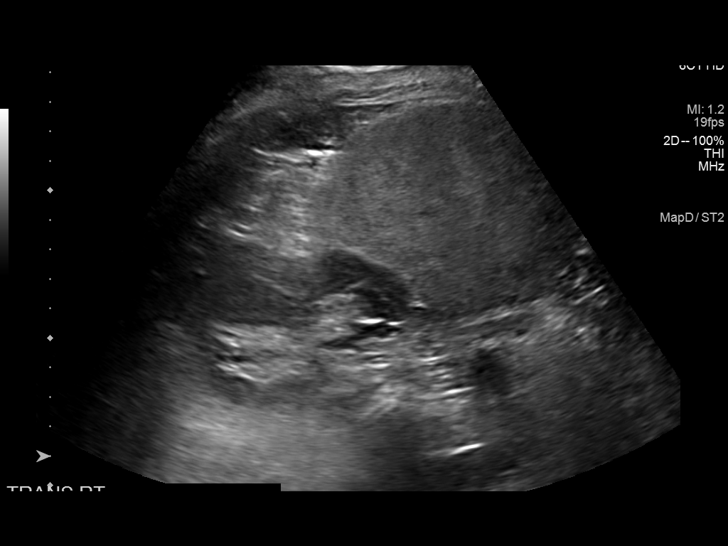
[im 11/44]
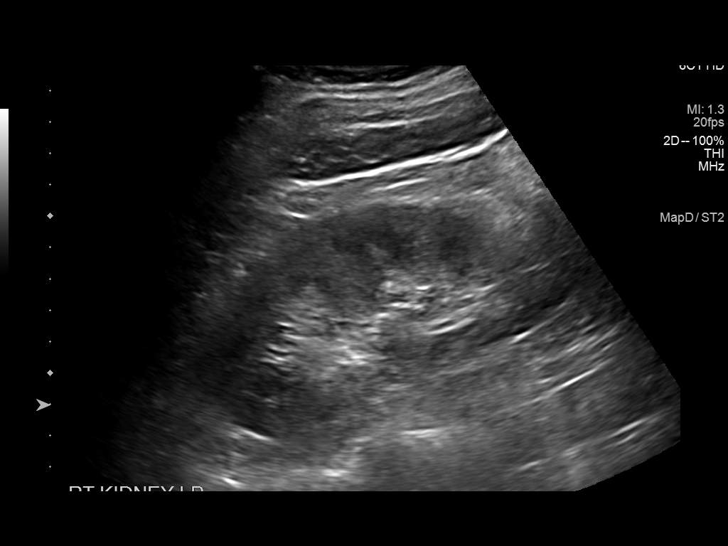
[im 15/44]
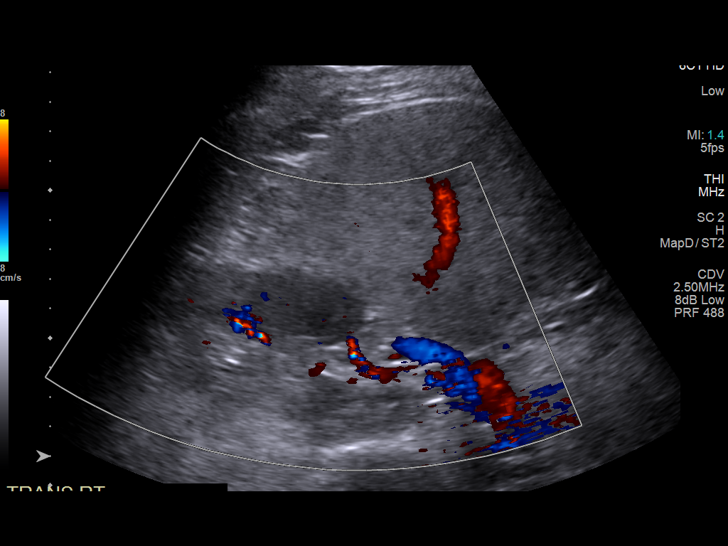
[im 17/44]
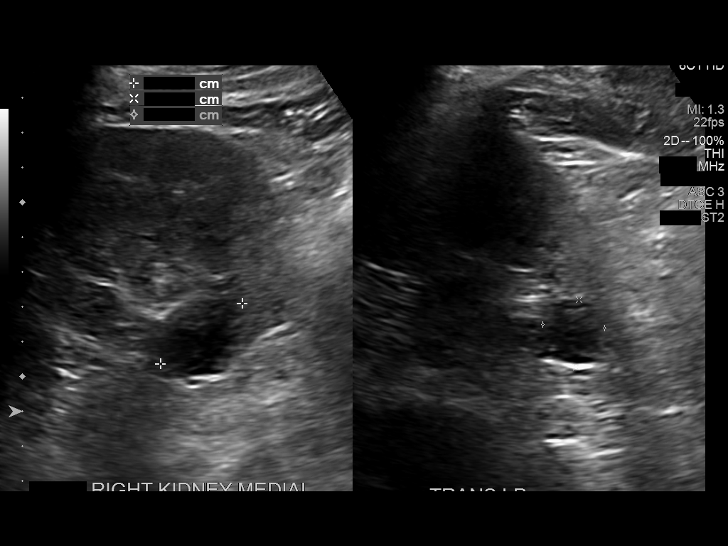
[im 20/44]
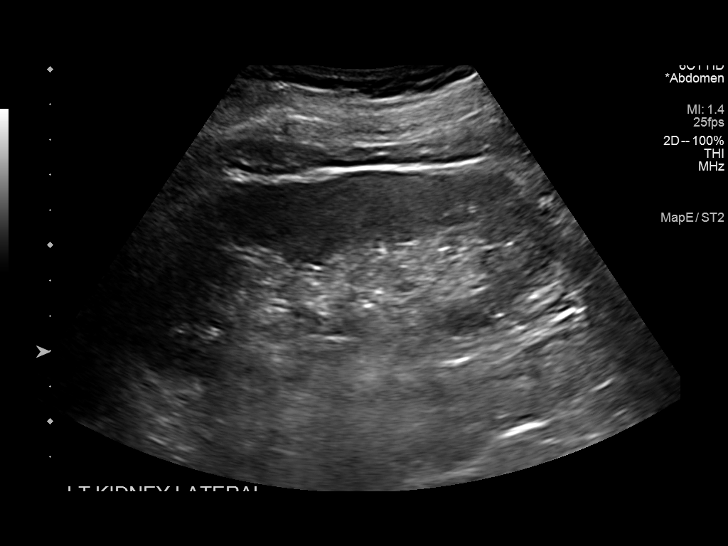
[im 24/44]
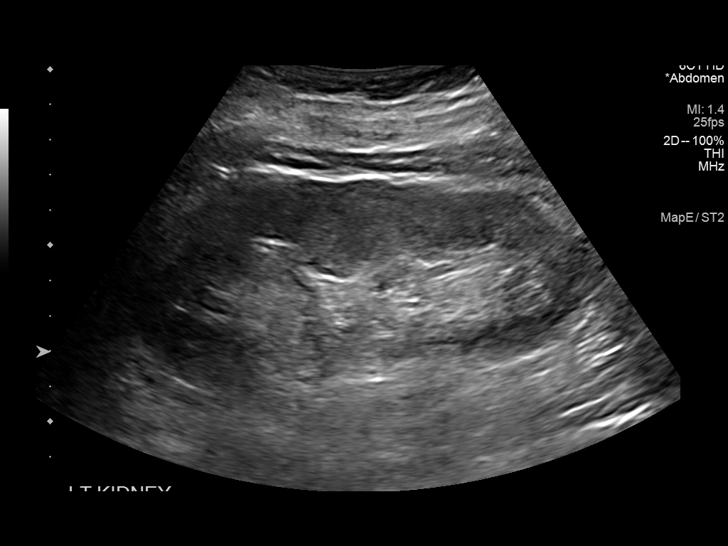
[im 27/44]
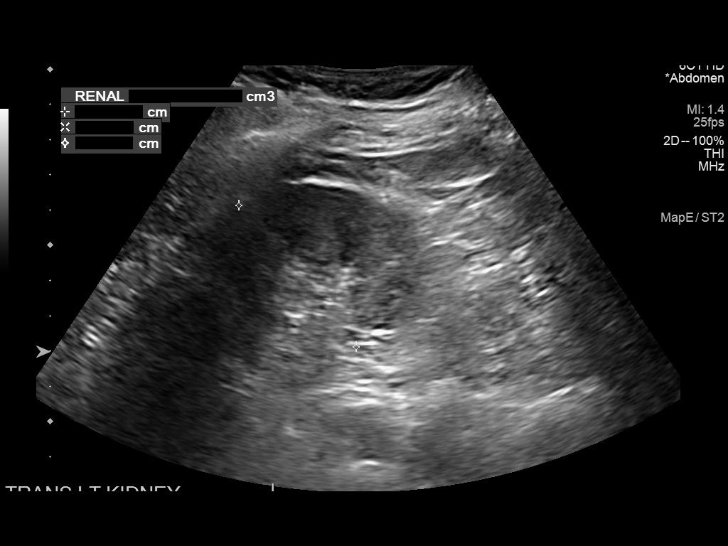
[im 29/44]
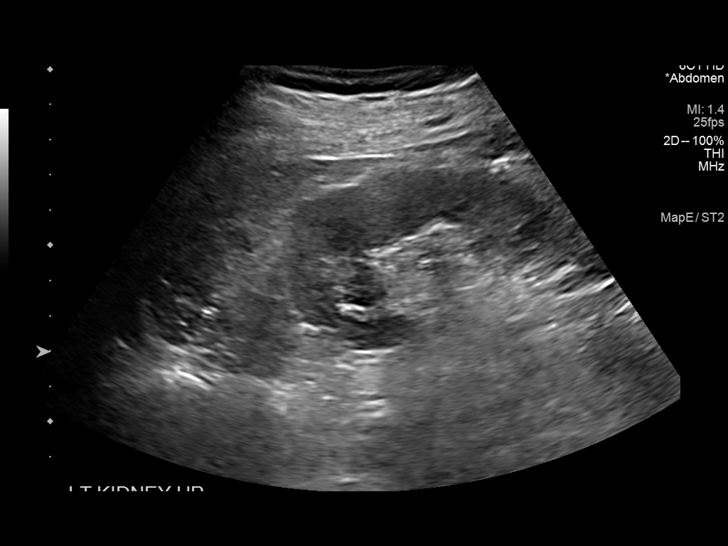
[im 33/44]
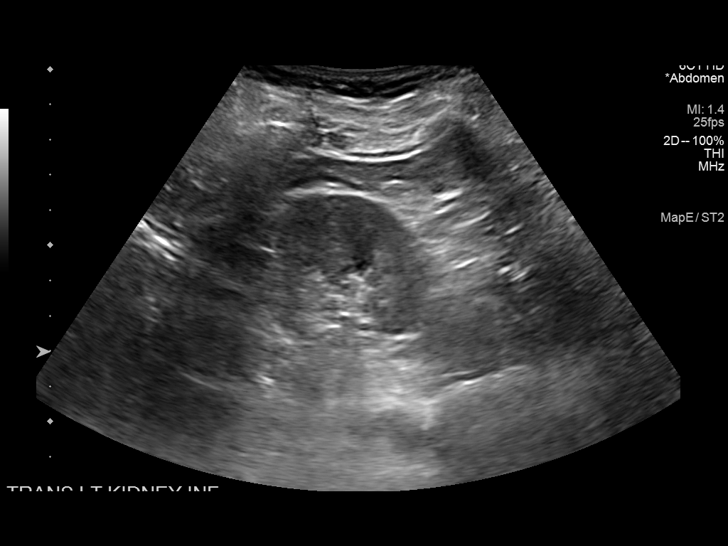
[im 36/44]
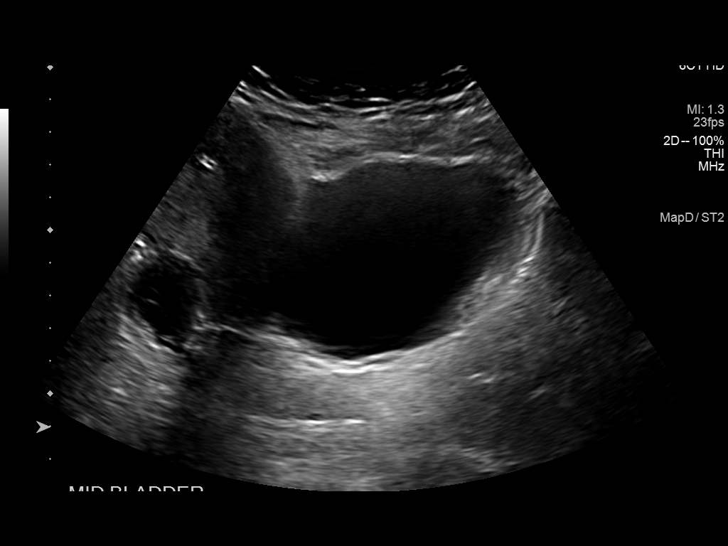
[im 40/44]
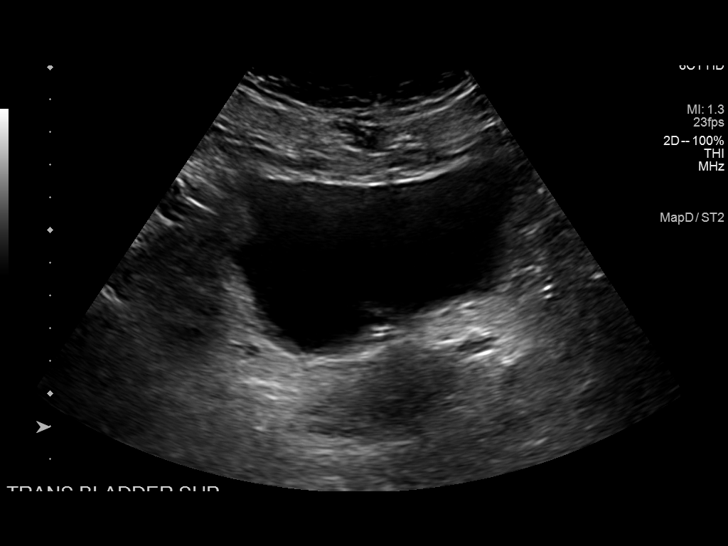
[im 44/44]
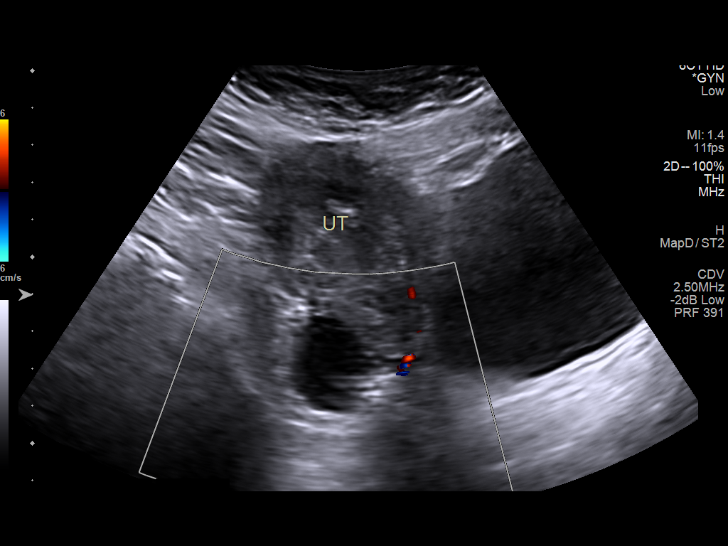

[14 of 25 positions shown; findings below may reference images not displayed]

FINDINGS: Right Kidney:

Renal measurements: 13.2 x 7 x 6.8 cm = volume: 329 mL. Echogenicity
within normal limits. No hydronephrosis. Cyst at the midpole
measuring 3 cm.

Left Kidney:

Renal measurements: 13 x 5.8 x 5.2 cm = volume: 205 mL. Echogenicity
within normal limits. No mass or hydronephrosis visualized.

Bladder:

Appears normal for degree of bladder distention.

Other:

None.
IMPRESSION: 1. Right renal cyst.
2. Otherwise negative renal ultrasound

## 2023-06-24 DIAGNOSIS — L570 Actinic keratosis: Secondary | ICD-10-CM | POA: Diagnosis not present

## 2023-06-24 DIAGNOSIS — D2262 Melanocytic nevi of left upper limb, including shoulder: Secondary | ICD-10-CM | POA: Diagnosis not present

## 2023-06-24 DIAGNOSIS — C44519 Basal cell carcinoma of skin of other part of trunk: Secondary | ICD-10-CM | POA: Diagnosis not present

## 2023-06-24 DIAGNOSIS — L814 Other melanin hyperpigmentation: Secondary | ICD-10-CM | POA: Diagnosis not present

## 2023-06-24 DIAGNOSIS — L821 Other seborrheic keratosis: Secondary | ICD-10-CM | POA: Diagnosis not present

## 2023-06-24 DIAGNOSIS — D485 Neoplasm of uncertain behavior of skin: Secondary | ICD-10-CM | POA: Diagnosis not present

## 2023-06-24 DIAGNOSIS — Z85828 Personal history of other malignant neoplasm of skin: Secondary | ICD-10-CM | POA: Diagnosis not present

## 2023-06-24 DIAGNOSIS — D2239 Melanocytic nevi of other parts of face: Secondary | ICD-10-CM | POA: Diagnosis not present

## 2023-06-24 DIAGNOSIS — D0439 Carcinoma in situ of skin of other parts of face: Secondary | ICD-10-CM | POA: Diagnosis not present

## 2023-06-24 DIAGNOSIS — C44612 Basal cell carcinoma of skin of right upper limb, including shoulder: Secondary | ICD-10-CM | POA: Diagnosis not present

## 2023-07-08 DIAGNOSIS — C44612 Basal cell carcinoma of skin of right upper limb, including shoulder: Secondary | ICD-10-CM | POA: Diagnosis not present

## 2023-07-08 DIAGNOSIS — D0439 Carcinoma in situ of skin of other parts of face: Secondary | ICD-10-CM | POA: Diagnosis not present

## 2023-07-10 DIAGNOSIS — E559 Vitamin D deficiency, unspecified: Secondary | ICD-10-CM | POA: Diagnosis not present

## 2023-07-10 DIAGNOSIS — E039 Hypothyroidism, unspecified: Secondary | ICD-10-CM | POA: Diagnosis not present

## 2023-07-10 DIAGNOSIS — N181 Chronic kidney disease, stage 1: Secondary | ICD-10-CM | POA: Diagnosis not present

## 2023-07-10 DIAGNOSIS — I10 Essential (primary) hypertension: Secondary | ICD-10-CM | POA: Diagnosis not present

## 2023-07-15 DIAGNOSIS — E559 Vitamin D deficiency, unspecified: Secondary | ICD-10-CM | POA: Diagnosis not present

## 2023-07-15 DIAGNOSIS — E039 Hypothyroidism, unspecified: Secondary | ICD-10-CM | POA: Diagnosis not present

## 2023-07-15 DIAGNOSIS — I10 Essential (primary) hypertension: Secondary | ICD-10-CM | POA: Diagnosis not present

## 2023-07-23 DIAGNOSIS — N181 Chronic kidney disease, stage 1: Secondary | ICD-10-CM | POA: Diagnosis not present

## 2023-07-23 DIAGNOSIS — E210 Primary hyperparathyroidism: Secondary | ICD-10-CM | POA: Diagnosis not present

## 2023-07-23 DIAGNOSIS — E039 Hypothyroidism, unspecified: Secondary | ICD-10-CM | POA: Diagnosis not present

## 2023-07-23 DIAGNOSIS — I129 Hypertensive chronic kidney disease with stage 1 through stage 4 chronic kidney disease, or unspecified chronic kidney disease: Secondary | ICD-10-CM | POA: Diagnosis not present

## 2023-08-11 DIAGNOSIS — I10 Essential (primary) hypertension: Secondary | ICD-10-CM | POA: Diagnosis not present

## 2023-08-11 DIAGNOSIS — Z23 Encounter for immunization: Secondary | ICD-10-CM | POA: Diagnosis not present

## 2023-09-03 DIAGNOSIS — R5383 Other fatigue: Secondary | ICD-10-CM | POA: Diagnosis not present

## 2023-09-03 DIAGNOSIS — E039 Hypothyroidism, unspecified: Secondary | ICD-10-CM | POA: Diagnosis not present

## 2023-09-09 DIAGNOSIS — E039 Hypothyroidism, unspecified: Secondary | ICD-10-CM | POA: Diagnosis not present

## 2023-09-09 DIAGNOSIS — I10 Essential (primary) hypertension: Secondary | ICD-10-CM | POA: Diagnosis not present

## 2023-09-09 DIAGNOSIS — Z79899 Other long term (current) drug therapy: Secondary | ICD-10-CM | POA: Diagnosis not present

## 2023-10-07 DIAGNOSIS — G4700 Insomnia, unspecified: Secondary | ICD-10-CM | POA: Diagnosis not present

## 2023-10-07 DIAGNOSIS — N951 Menopausal and female climacteric states: Secondary | ICD-10-CM | POA: Diagnosis not present

## 2023-10-26 DIAGNOSIS — Z0000 Encounter for general adult medical examination without abnormal findings: Secondary | ICD-10-CM | POA: Diagnosis not present

## 2023-10-28 DIAGNOSIS — I10 Essential (primary) hypertension: Secondary | ICD-10-CM | POA: Diagnosis not present

## 2023-10-28 DIAGNOSIS — E039 Hypothyroidism, unspecified: Secondary | ICD-10-CM | POA: Diagnosis not present

## 2023-10-28 DIAGNOSIS — Z79899 Other long term (current) drug therapy: Secondary | ICD-10-CM | POA: Diagnosis not present

## 2023-10-28 DIAGNOSIS — G4700 Insomnia, unspecified: Secondary | ICD-10-CM | POA: Diagnosis not present

## 2024-01-28 DIAGNOSIS — F411 Generalized anxiety disorder: Secondary | ICD-10-CM | POA: Diagnosis not present

## 2024-01-28 DIAGNOSIS — I10 Essential (primary) hypertension: Secondary | ICD-10-CM | POA: Diagnosis not present

## 2024-01-28 DIAGNOSIS — E039 Hypothyroidism, unspecified: Secondary | ICD-10-CM | POA: Diagnosis not present

## 2024-01-28 DIAGNOSIS — G4700 Insomnia, unspecified: Secondary | ICD-10-CM | POA: Diagnosis not present

## 2024-02-08 DIAGNOSIS — N181 Chronic kidney disease, stage 1: Secondary | ICD-10-CM | POA: Diagnosis not present

## 2024-02-08 DIAGNOSIS — E210 Primary hyperparathyroidism: Secondary | ICD-10-CM | POA: Diagnosis not present

## 2024-02-08 DIAGNOSIS — E039 Hypothyroidism, unspecified: Secondary | ICD-10-CM | POA: Diagnosis not present

## 2024-02-08 DIAGNOSIS — I129 Hypertensive chronic kidney disease with stage 1 through stage 4 chronic kidney disease, or unspecified chronic kidney disease: Secondary | ICD-10-CM | POA: Diagnosis not present

## 2024-02-22 DIAGNOSIS — G43909 Migraine, unspecified, not intractable, without status migrainosus: Secondary | ICD-10-CM | POA: Diagnosis not present

## 2024-02-22 DIAGNOSIS — J309 Allergic rhinitis, unspecified: Secondary | ICD-10-CM | POA: Diagnosis not present

## 2024-02-22 DIAGNOSIS — G43109 Migraine with aura, not intractable, without status migrainosus: Secondary | ICD-10-CM | POA: Diagnosis not present

## 2024-03-01 IMAGING — CT CT CHEST W/ CM
2 of 3 series · 15 of 36 positions shown, 18 images · IV contrast (agent unspecified)
Comparison: Chest radiographs, 09/25/2021

CLINICAL DATA: Preoperative, evaluate for suspected right upper
lobe pulmonary nodule

EXAM:
CT CHEST WITH CONTRAST
TECHNIQUE: Multidetector CT imaging of the chest was performed during
intravenous contrast administration.

[Series 3: axial st · axial · 0.74mm/px · z∈[+991,+1267]mm · 12 of 162 slices shown, 15 images]
[im 12/162  mediastinal]
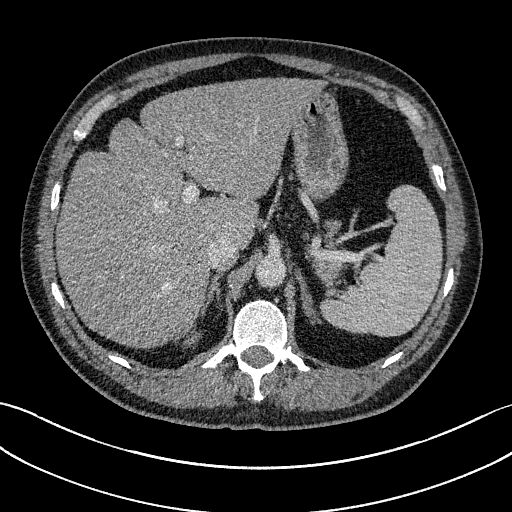
[im 12/162  lung]
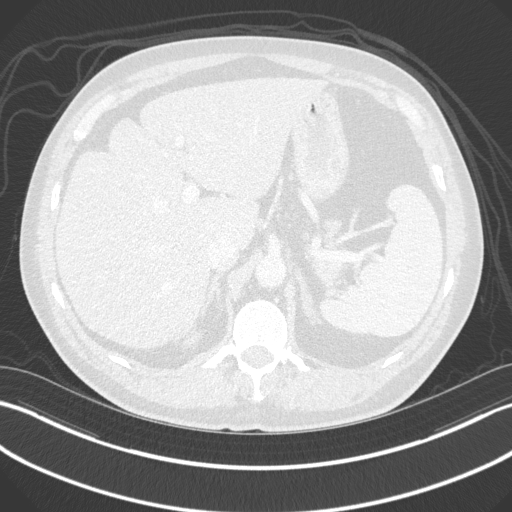
[im 24/162  lung]
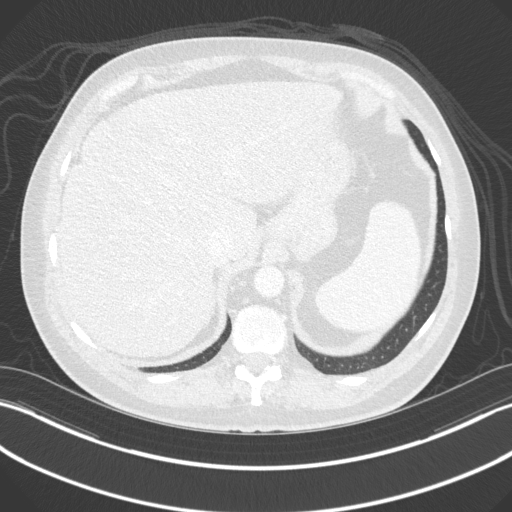
[im 36/162  lung]
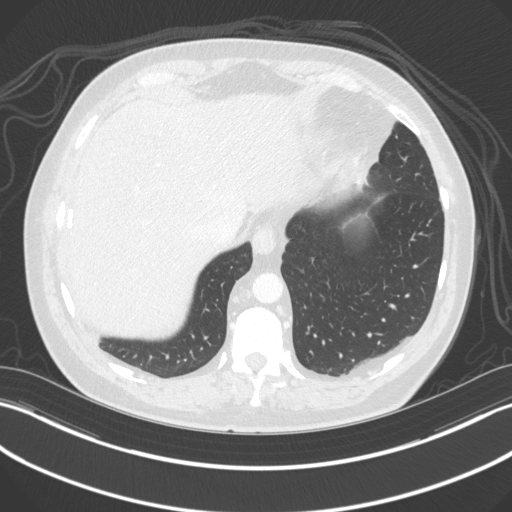
[im 48/162  lung]
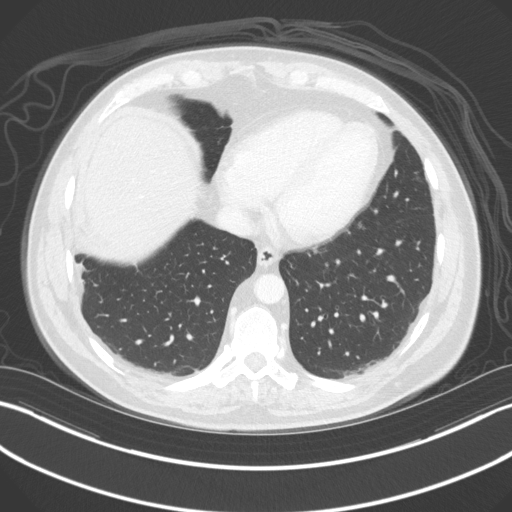
[im 60/162  mediastinal]
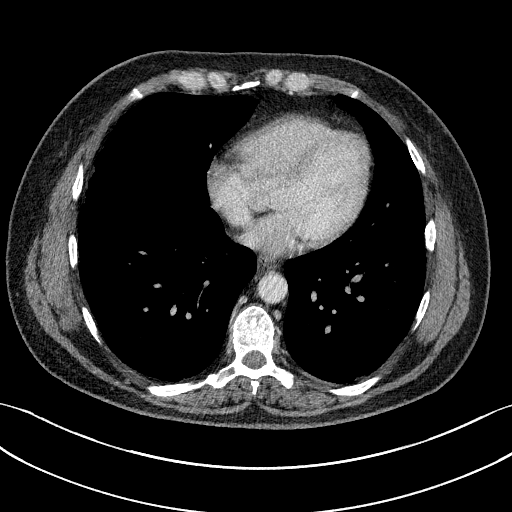
[im 60/162  lung]
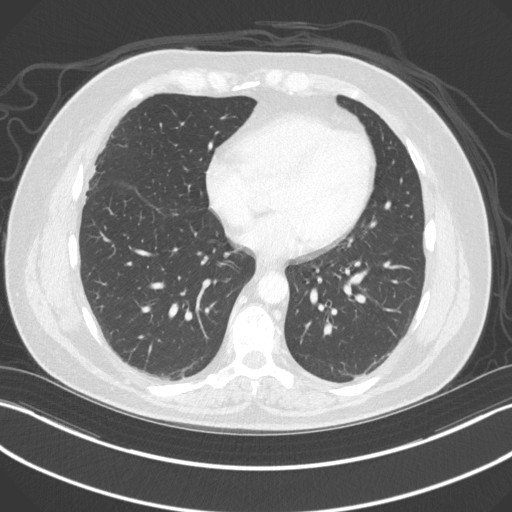
[im 72/162  lung]
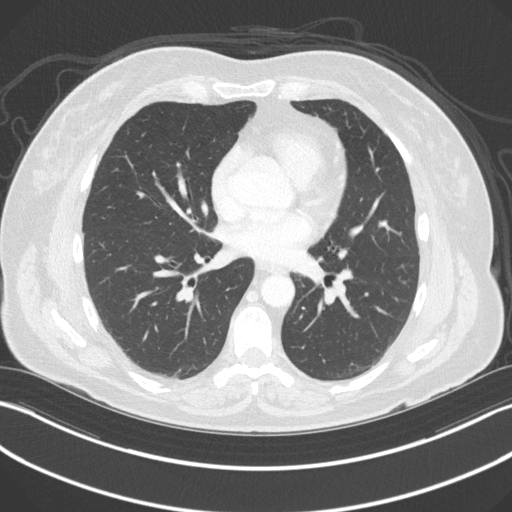
[im 90/162  lung]
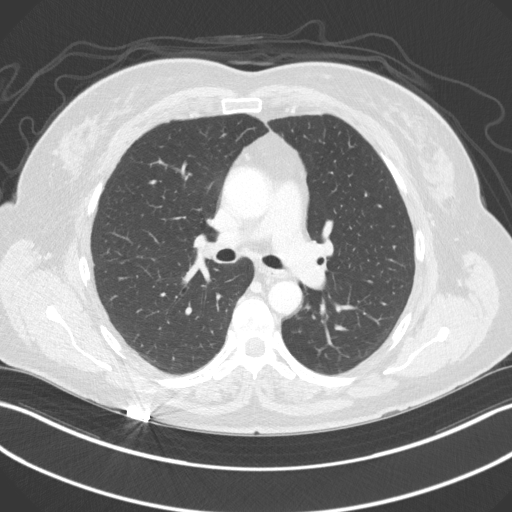
[im 102/162  lung]
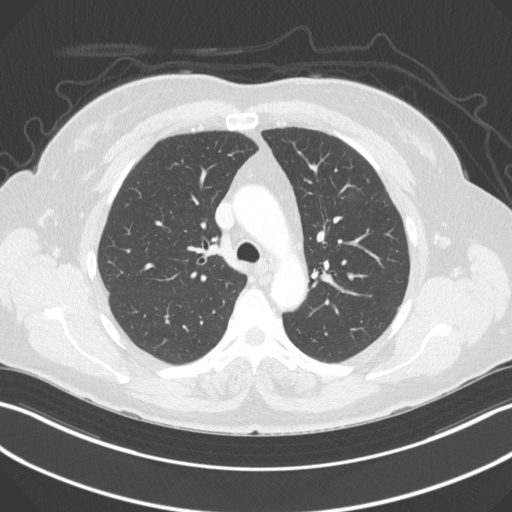
[im 114/162  mediastinal]
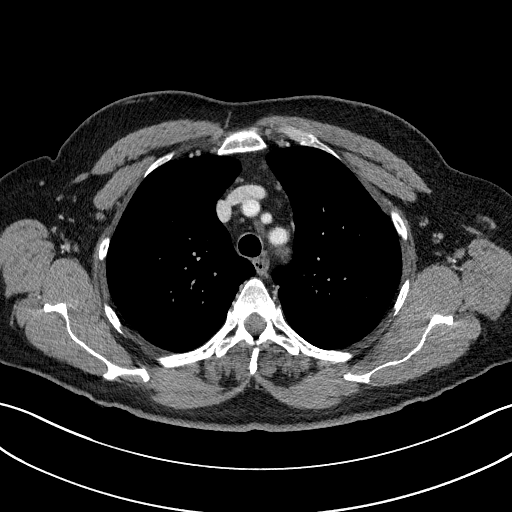
[im 114/162  lung]
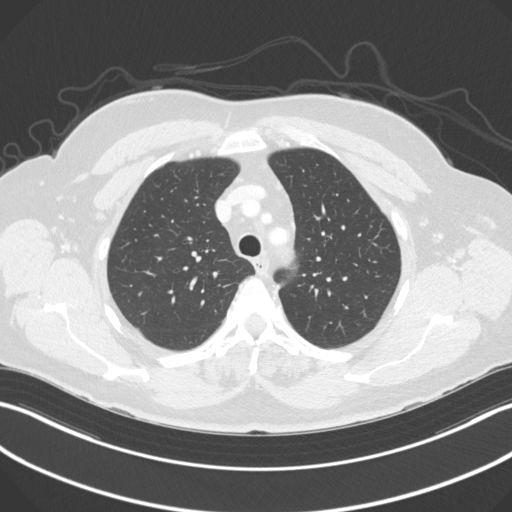
[im 126/162  lung]
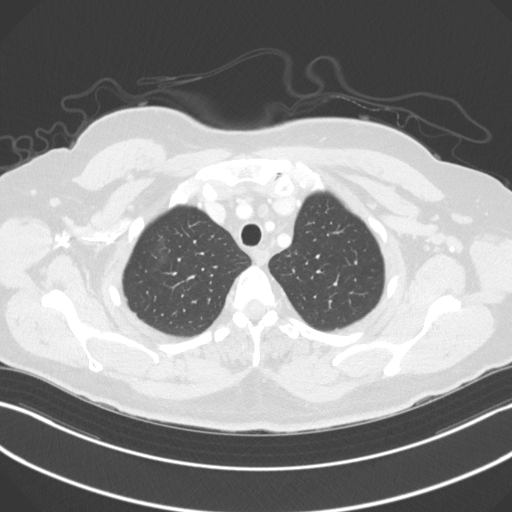
[im 138/162  lung]
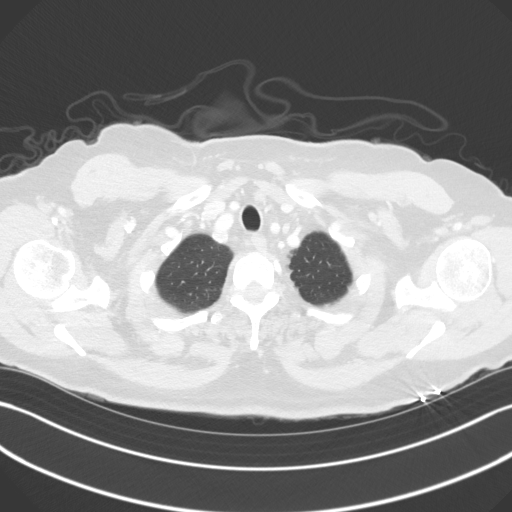
[im 150/162  lung]
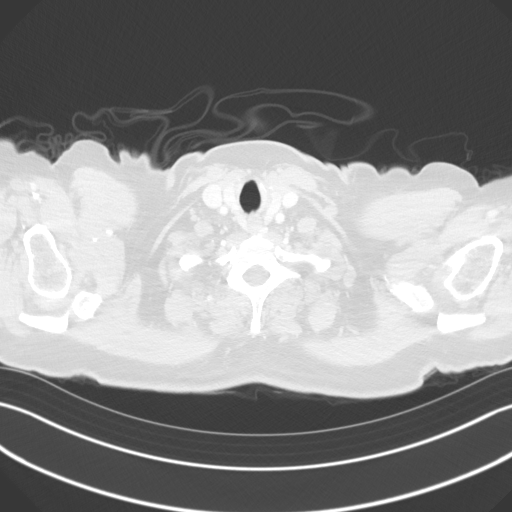

[Series 6: coronal · coronal · 0.63mm/px · 3 of 149 slices shown]
[im 30/149  lung]
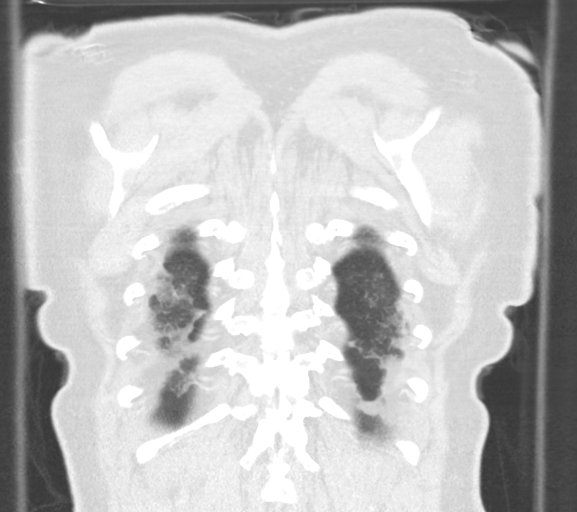
[im 60/149  lung]
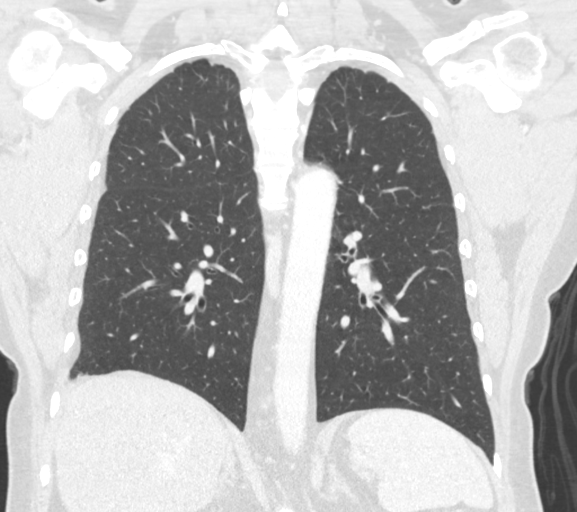
[im 89/149  lung]
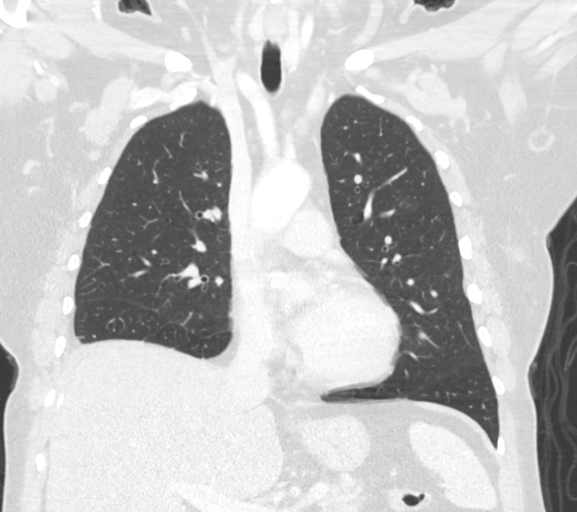

[15 of 36 positions shown; findings below may reference images not displayed]

RADIATION DOSE REDUCTION: This exam was performed according to the
departmental dose-optimization program which includes automated
exposure control, adjustment of the mA and/or kV according to
patient size and/or use of iterative reconstruction technique.

CONTRAST:  80mL OMNIPAQUE IOHEXOL 300 MG/ML  SOLN
FINDINGS: Cardiovascular: Scattered aortic atherosclerosis. Normal heart size.
No pericardial effusion.

Mediastinum/Nodes: No enlarged mediastinal, hilar, or axillary lymph
nodes. Thyroid gland, trachea, and esophagus demonstrate no
significant findings.

Lungs/Pleura: Lungs are clear. No pleural effusion or pneumothorax.

Upper Abdomen: No acute abnormality. Hepatic steatosis.
Diverticulosis of the included transverse colon.

Musculoskeletal: Anatomic variant bridging of the lateral right
first and second ribs with pseudoarticulation (series 3, image 32,
series 6, image 27). No suspicious osseous lesions identified.
IMPRESSION: 1. Congenital anatomic variant bridging of the lateral right first
and second ribs with pseudoarticulation (Bernardino anomaly). No further
follow-up or characterization is required for this benign incidental
finding. No pulmonary nodule or suspicious findings.
2. Hepatic steatosis.
3. Diverticulosis of the included transverse colon.

Aortic Atherosclerosis ([id number]-R0S.S).

## 2024-03-07 DIAGNOSIS — R0683 Snoring: Secondary | ICD-10-CM | POA: Diagnosis not present

## 2024-03-07 DIAGNOSIS — R400 Somnolence: Secondary | ICD-10-CM | POA: Diagnosis not present

## 2024-03-07 DIAGNOSIS — Z720 Tobacco use: Secondary | ICD-10-CM | POA: Diagnosis not present

## 2024-03-07 DIAGNOSIS — I2510 Atherosclerotic heart disease of native coronary artery without angina pectoris: Secondary | ICD-10-CM | POA: Diagnosis not present

## 2024-03-07 DIAGNOSIS — G43109 Migraine with aura, not intractable, without status migrainosus: Secondary | ICD-10-CM | POA: Diagnosis not present

## 2024-03-09 ENCOUNTER — Ambulatory Visit: Admitting: Endocrinology

## 2024-04-01 ENCOUNTER — Ambulatory Visit: Admitting: "Endocrinology

## 2024-04-01 ENCOUNTER — Encounter: Payer: Self-pay | Admitting: "Endocrinology

## 2024-04-01 VITALS — BP 100/80 | HR 75 | Ht 69.0 in | Wt 207.0 lb

## 2024-04-01 DIAGNOSIS — Z9089 Acquired absence of other organs: Secondary | ICD-10-CM | POA: Diagnosis not present

## 2024-04-01 DIAGNOSIS — E890 Postprocedural hypothyroidism: Secondary | ICD-10-CM

## 2024-04-01 DIAGNOSIS — Z9889 Other specified postprocedural states: Secondary | ICD-10-CM

## 2024-04-01 DIAGNOSIS — Z8639 Personal history of other endocrine, nutritional and metabolic disease: Secondary | ICD-10-CM

## 2024-04-01 MED ORDER — LIOTHYRONINE SODIUM 5 MCG PO TABS: 5.0000 ug | ORAL_TABLET | Freq: Every day | ORAL | 0 refills | Status: AC

## 2024-04-01 NOTE — Progress Notes (Signed)
 "    Outpatient Endocrinology Note Obadiah Birmingham, MD  04/01/2024   Paula Fernandez Sep 05, 1970 [id number]  Referring Provider: Rayburn Pac, MD Primary Care Provider: Waylan Almarie SAUNDERS, MD Subjective  No chief complaint on file.   Assessment & Plan  Diagnoses and all orders for this visit:  Postsurgical hypothyroidism -     liothyronine (CYTOMEL) 5 MCG tablet; Take 1 tablet (5 mcg total) by mouth daily. -     Cancel: TSH -     Cancel: T3, free -     Cancel: T4, free -     T3, free -     T4, free -     TSH  H/O total thyroidectomy  History of hypercalcemia   Paula Fernandez is currently taking levothyroxine  125 mcg Tuesday Thursday Saturday and Sunday and 150 mcg on Monday, Wednesday and Friday. Patient is currently biochemically euthyroid.  Educated on thyroid  axis.  Recommend the following: Continue current dose.  04/01/24: Add liothyronine 5 mcg p.o. every morning to help with fatigue.  Patient has no cardiac history or palpitations in herself and understands the risks involved with liothyronine . advised to take levothyroxine  first thing in the morning on empty stomach and wait at least 30 minutes to 1 hour before eating or drinking anything or taking any other medications. Space out levothyroxine  by 4 hours from any acid reflux medication/fibrate/iron/calcium/multivitamin. Advised to take birth control pills and nutritional supplements in the evening. Repeat lab before next visit or sooner if symptoms of hyperthyroidism or hypothyroidism develop.  Notify us  immediately in case of significant weight gain or loss. Counseled on compliance and follow up needs.  History of primary hyperparathyroidism/calcium per patient/chart 10/03/21: Primary hyperparathyroidism, thyroid  neoplasm of uncertain behavior, multiple thyroid  nodules (crm)  A. PARATHYROID , LEFT SUPERIOR, ADENOMA, PARATHYROIDECTOMY:  - Parathyroid  adenoma, 1.295 g.  B. 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   Labs reviewed from 01/22/2024: TSH 2.5, calcium 9.8, albumin 4.6 Continue monitoring calcium  I have reviewed current medications, nurse's notes, allergies, vital signs, past medical and surgical history, family medical history, and social history for this encounter. Counseled patient on symptoms, examination findings, lab findings, imaging results, treatment decisions and monitoring and prognosis. The patient understood the recommendations and agrees with the treatment plan. All questions regarding treatment plan were fully answered.   Return in about 3 months (around 06/30/2024) for visit + labs before next visit.   Obadiah Birmingham, MD  04/01/2024   I have reviewed current medications, nurse's notes, allergies, vital signs, past medical and surgical history, family medical history, and social history for this encounter. Counseled patient on symptoms, examination findings, lab findings, imaging results, treatment decisions and monitoring and prognosis. The patient understood the recommendations and agrees with the treatment plan. All questions regarding treatment plan were fully answered.   History of Present Illness Paula Fernandez is a 53 y.o. year old female who presents to our clinic with postobstructive diagnosed in 2023.    S/p total thyroidectomy + one parathyroidectomy by Krystal Montenegro around 2023  Symptoms suggestive of HYPOTHYROIDISM:  fatigue Yes weight gain No cold intolerance  No constipation  No  Symptoms suggestive of HYPERTHYROIDISM:  weight loss  No heat intolerance Yes hyperdefecation  No palpitations  No  Compressive symptoms:  dysphagia  No dysphonia  No positional dyspnea (especially with simultaneous arms elevation)  No  Smokes  No, past smoker, vaping nicotine  On biotin  No  Personal history of head/neck surgery/irradiation  Yes  Physical Exam  BP 100/80   Pulse 75   Ht 5' 9 (1.753 m)    Wt 207 lb (93.9 kg)   SpO2 98%   BMI 30.57 kg/m  Constitutional: well developed, well nourished Head: normocephalic, atraumatic, no exophthalmos Eyes: sclera anicteric, no redness Neck: + thrombectomy scar well healed  Lungs: normal respiratory effort Neurology: alert and oriented, no fine hand tremor Skin: dry, no appreciable rashes Musculoskeletal: no appreciable defects Psychiatric: normal mood and affect  Allergies Allergies[1]  Current Medications Patient's Medications  New Prescriptions   LIOTHYRONINE (CYTOMEL) 5 MCG TABLET    Take 1 tablet (5 mcg total) by mouth daily.  Previous Medications   AMLODIPINE  (NORVASC ) 5 MG TABLET    Take 5 mg by mouth daily.   ASPIRIN 81 MG EC TABLET    Take 81 mg by mouth daily.   BUPROPION  (WELLBUTRIN  XL) 150 MG 24 HR TABLET    Take 150 mg by mouth every morning.   FOLIC ACID (FOLVITE) 1 MG TABLET    Take 1 mg by mouth daily.   LEVOTHYROXINE  (SYNTHROID ) 200 MCG TABLET    Take 200 mcg by mouth daily before breakfast.   LORATADINE (CLARITIN) 10 MG TABLET    Take 10 mg by mouth at bedtime.   MELATONIN 10 MG TABS    Take 10 mg by mouth at bedtime as needed (sleep).   MELOXICAM  (MOBIC ) 15 MG TABLET    Take 1 tablet (15 mg total) by mouth daily.   METHYLPREDNISOLONE  (MEDROL  DOSEPAK) 4 MG TBPK TABLET    6 day dose pack - take as directed   NATURAL VITAMIN D-3 125 MCG (5000 UT) TABS    Take 1 tablet by mouth daily.   OLMESARTAN (BENICAR) 40 MG TABLET    Take 40 mg by mouth daily.   PANTOPRAZOLE  (PROTONIX ) 20 MG TABLET    Take 1 tablet (20 mg total) by mouth daily.   SERTRALINE  (ZOLOFT ) 50 MG TABLET    Take 50 mg by mouth daily.   SIMETHICONE  125 MG CAPS    Take 1 capsule (125 mg total) by mouth 4 (four) times daily as needed.  Modified Medications   No medications on file  Discontinued Medications   No medications on file    Past Medical History Past Medical History:  Diagnosis Date   Chronic kidney disease    GERD (gastroesophageal reflux  disease)    Heartburn    Hypertension    Thyroid  disease     Past Surgical History Past Surgical History:  Procedure Laterality Date   CESAREAN SECTION  2000   PARATHYROIDECTOMY Left 10/03/2021   Procedure: LEFT SUPERIOR PARATHYROIDECTOMY;  Surgeon: Eletha Boas, MD;  Location: [facility];  Service: General;  Laterality: Left;   THYROIDECTOMY N/A 10/03/2021   Procedure: TOTAL THYROIDECTOMY;  Surgeon: Eletha Boas, MD;  Location: [facility];  Service: General;  Laterality: N/A;    Family History family history is not on file.  Social History Social History   Socioeconomic History   Marital status: Divorced    Spouse name: Not on file   Number of children: Not on file   Years of education: Not on file   Highest education level: Not on file  Occupational History   Not on file  Tobacco Use   Smoking status: Every Day    Current packs/day: 0.50    Average packs/day: 0.5 packs/day for 21.0 years (10.5 ttl pk-yrs)  Types: Cigarettes   Smokeless tobacco: Never   Tobacco comments:    quit with pregnancies and at times  Vaping Use   Vaping status: Never Used  Substance and Sexual Activity   Alcohol use: Yes    Alcohol/week: 3.0 standard drinks of alcohol    Types: 1 Cans of beer, 2 Standard drinks or equivalent per week    Comment: occas   Drug use: Never   Sexual activity: Not on file  Other Topics Concern   Not on file  Social History Narrative   Not on file   Social Drivers of Health   Tobacco Use: High Risk (04/01/2024)   Patient History    Smoking Tobacco Use: Every Day    Smokeless Tobacco Use: Never    Passive Exposure: Not on file  Financial Resource Strain: Not on file  Food Insecurity: Not on file  Transportation Needs: Not on file  Physical Activity: Not on file  Stress: Not on file  Social Connections: Not on file  Intimate Partner Violence: Not on file  Depression (PHQ2-9): Not on file  Alcohol Screen: Not on file  Housing: Not on file  Utilities: Not  on file  Health Literacy: Not on file    Laboratory Investigations No results found for: TSH, FREET4   No results found for: TSI   No components found for: TRAB   No results found for: CHOL No results found for: HDL No results found for: LDLCALC No results found for: TRIG No results found for: [facility] Lab Results  Component Value Date   CREATININE 0.92 01/02/2023   No results found for: GFR    Component Value Date/Time   NA 138 01/02/2023 1838   K 4.3 01/02/2023 1838   CL 99 01/02/2023 1838   CO2 29 01/02/2023 1838   GLUCOSE 94 01/02/2023 1838   BUN 15 01/02/2023 1838   CREATININE 0.92 01/02/2023 1838   CALCIUM 10.4 (H) 01/02/2023 1838   PROT 7.4 01/02/2023 2015   ALBUMIN 4.6 01/02/2023 2015   AST 20 01/02/2023 2015   ALT 25 01/02/2023 2015   ALKPHOS 51 01/02/2023 2015   BILITOT 0.6 01/02/2023 2015   GFRNONAA >60 01/02/2023 1838      Latest Ref Rng & Units 01/02/2023    6:38 PM 10/04/2021    5:13 AM 09/25/2021    3:01 PM  BMP  Glucose 70 - 99 mg/dL 94  876  84   BUN 6 - 20 mg/dL 15  14  9    Creatinine 0.44 - 1.00 mg/dL 9.07  9.24  9.31   Sodium 135 - 145 mmol/L 138  139  137   Potassium 3.5 - 5.1 mmol/L 4.3  4.3  4.5   Chloride 98 - 111 mmol/L 99  106  106   CO2 22 - 32 mmol/L 29  25  27    Calcium 8.9 - 10.3 mg/dL 89.5  9.3  89.5        Component Value Date/Time   WBC 8.7 01/02/2023 1838   RBC 5.11 01/02/2023 1838   HGB 16.1 (H) 01/02/2023 1838   HCT 45.6 01/02/2023 1838   PLT 265 01/02/2023 1838   MCV 89.2 01/02/2023 1838   MCH 31.5 01/02/2023 1838   MCHC 35.3 01/02/2023 1838   RDW 11.4 (L) 01/02/2023 1838      Parts of this note may have been dictated using voice recognition software. There may be variances in spelling and vocabulary which are unintentional. Not all errors are  proofread. Please notify the dino if any discrepancies are noted or if the meaning of any statement is not clear.       [1]  Allergies Allergen  Reactions   Zosyn [Piperacillin Sod-Tazobactam So] Itching   Lisinopril Cough   "

## 2024-06-30 ENCOUNTER — Ambulatory Visit: Admitting: "Endocrinology
